# Patient Record
Sex: Male | Born: 1935 | Race: White | Hispanic: No | Marital: Single | Smoking: Former smoker
Health system: Southern US, Community
[De-identification: ages and names within clinical notes are randomized; demographics above are authoritative.]

## PROBLEM LIST (undated history)

## (undated) DIAGNOSIS — I219 Acute myocardial infarction, unspecified: Secondary | ICD-10-CM

## (undated) DIAGNOSIS — I2510 Atherosclerotic heart disease of native coronary artery without angina pectoris: Secondary | ICD-10-CM

## (undated) DIAGNOSIS — M4310 Spondylolisthesis, site unspecified: Secondary | ICD-10-CM

## (undated) DIAGNOSIS — M4800 Spinal stenosis, site unspecified: Secondary | ICD-10-CM

## (undated) HISTORY — DX: Spondylolisthesis, site unspecified: M43.10

## (undated) HISTORY — DX: Acute myocardial infarction, unspecified: I21.9

## (undated) HISTORY — PX: LUMBAR LAMINECTOMY: SHX95

## (undated) HISTORY — DX: Spinal stenosis, site unspecified: M48.00

## (undated) HISTORY — DX: Atherosclerotic heart disease of native coronary artery without angina pectoris: I25.10

---

## 1976-05-13 DIAGNOSIS — I219 Acute myocardial infarction, unspecified: Secondary | ICD-10-CM

## 1976-05-13 HISTORY — DX: Acute myocardial infarction, unspecified: I21.9

## 1997-05-13 HISTORY — PX: CORONARY ARTERY BYPASS GRAFT: SHX141

## 1997-08-22 ENCOUNTER — Inpatient Hospital Stay (HOSPITAL_COMMUNITY): Admission: RE | Admit: 1997-08-22 | Discharge: 1997-08-25 | Payer: Self-pay | Admitting: Cardiothoracic Surgery

## 1999-02-23 ENCOUNTER — Encounter: Payer: Self-pay | Admitting: Emergency Medicine

## 1999-02-24 ENCOUNTER — Inpatient Hospital Stay (HOSPITAL_COMMUNITY): Admission: EM | Admit: 1999-02-24 | Discharge: 1999-02-25 | Payer: Self-pay | Admitting: Emergency Medicine

## 1999-02-25 ENCOUNTER — Encounter: Payer: Self-pay | Admitting: Cardiology

## 1999-02-25 ENCOUNTER — Encounter: Payer: Self-pay | Admitting: Emergency Medicine

## 2001-04-17 ENCOUNTER — Encounter: Payer: Self-pay | Admitting: Orthopaedic Surgery

## 2001-04-17 ENCOUNTER — Ambulatory Visit (HOSPITAL_COMMUNITY): Admission: RE | Admit: 2001-04-17 | Discharge: 2001-04-17 | Payer: Self-pay | Admitting: Orthopaedic Surgery

## 2001-05-14 ENCOUNTER — Encounter: Payer: Self-pay | Admitting: Orthopaedic Surgery

## 2001-05-22 ENCOUNTER — Inpatient Hospital Stay (HOSPITAL_COMMUNITY): Admission: RE | Admit: 2001-05-22 | Discharge: 2001-05-26 | Payer: Self-pay | Admitting: Orthopaedic Surgery

## 2001-05-22 ENCOUNTER — Encounter: Payer: Self-pay | Admitting: Orthopaedic Surgery

## 2013-02-12 ENCOUNTER — Encounter: Payer: Self-pay | Admitting: *Deleted

## 2013-02-15 ENCOUNTER — Other Ambulatory Visit: Payer: Self-pay

## 2013-02-15 ENCOUNTER — Ambulatory Visit (HOSPITAL_COMMUNITY): Payer: Medicare HMO | Attending: Cardiology | Admitting: Radiology

## 2013-02-15 ENCOUNTER — Ambulatory Visit (INDEPENDENT_AMBULATORY_CARE_PROVIDER_SITE_OTHER): Payer: Medicare HMO | Admitting: Cardiology

## 2013-02-15 ENCOUNTER — Encounter: Payer: Self-pay | Admitting: Cardiology

## 2013-02-15 VITALS — BP 164/78 | HR 51 | Ht 68.0 in | Wt 164.0 lb

## 2013-02-15 DIAGNOSIS — I2510 Atherosclerotic heart disease of native coronary artery without angina pectoris: Secondary | ICD-10-CM | POA: Insufficient documentation

## 2013-02-15 DIAGNOSIS — E785 Hyperlipidemia, unspecified: Secondary | ICD-10-CM | POA: Insufficient documentation

## 2013-02-15 DIAGNOSIS — I25810 Atherosclerosis of coronary artery bypass graft(s) without angina pectoris: Secondary | ICD-10-CM | POA: Insufficient documentation

## 2013-02-15 DIAGNOSIS — I252 Old myocardial infarction: Secondary | ICD-10-CM | POA: Insufficient documentation

## 2013-02-15 DIAGNOSIS — I10 Essential (primary) hypertension: Secondary | ICD-10-CM | POA: Insufficient documentation

## 2013-02-15 HISTORY — DX: Atherosclerotic heart disease of native coronary artery without angina pectoris: I25.10

## 2013-02-15 NOTE — Patient Instructions (Addendum)

## 2013-02-15 NOTE — Progress Notes (Signed)
Echocardiogram performed.  

## 2013-02-15 NOTE — Progress Notes (Signed)
Patient ID: [name] [name], male   DOB: Jun 04, 1934, 78 y.o.   MRN: [id number]    Patient Name: [name] [name] Date of Encounter: 02/15/2013  Primary Care Provider:  No primary provider on file. Primary Cardiologist:  Tobias Alexander, H  Patient Profile  CAD, establishing care  Problem List   Past Medical History  Diagnosis Date  . Spondylolisthesis     L4-5  . Spinal stenosis     L4-5  . MI (myocardial infarction) 1978  . CAD (coronary artery disease)    Past Surgical History  Procedure Laterality Date  . Coronary artery bypass graft  1999    3 VESSEL  . Lumbar laminectomy      with partial facetectomy (L2-3, L3-4, L4-5, L5-S1)    Allergies  No Known Allergies  HPI  78 year old male with h/o CAD, s/o CABG in 1998. He reports that he felt exertional right shoulder pain and chest tightness prior to the surgery. Tat resolved and he has been asymptomatic since then. He has been followed by a PCP. He is very active, walking several miles a day, working in a back yard without any limitations. He denies palpitations, syncope. He is compliant with his meds.  Home Medications  Prior to Admission medications   Medication Sig Start Date End Date Taking? Authorizing Provider  aspirin 81 MG tablet Take 81 mg by mouth daily.   Yes Historical Provider, MD  beta carotene w/minerals (OCUVITE) tablet Take 2 tablets by mouth daily.   Yes Historical Provider, MD  fish oil-omega-3 fatty acids 1000 MG capsule Take 2 g by mouth daily.   Yes Historical Provider, MD  metoprolol succinate (TOPROL-XL) 25 MG 24 hr tablet  01/26/13  Yes Historical Provider, MD  naproxen (NAPROSYN) 375 MG tablet  01/11/13  Yes Historical Provider, MD  pravastatin (PRAVACHOL) 80 MG tablet  02/04/13  Yes Historical Provider, MD    Family History  No family history on file.  Social History  History   Social History  . Marital Status: Single    Spouse Name: N/A    Number of Children: N/A  . Years of  Education: N/A   Occupational History  . Not on file.   Social History Main Topics  . Smoking status: Former Smoker    Types: Cigarettes    Quit date: 11/19/1976  . Smokeless tobacco: Not on file  . Alcohol Use: Not on file  . Drug Use: Not on file  . Sexual Activity: Not on file   Other Topics Concern  . Not on file   Social History Narrative  . No narrative on file     Review of Systems General:  No chills, fever, night sweats or weight changes.  Cardiovascular:  No chest pain, dyspnea on exertion, edema, orthopnea, palpitations, paroxysmal nocturnal dyspnea. Dermatological: No rash, lesions/masses Respiratory: No cough, dyspnea Urologic: No hematuria, dysuria Abdominal:   No nausea, vomiting, diarrhea, bright red blood per rectum, melena, or hematemesis Neurologic:  No visual changes, wkns, changes in mental status. All other systems reviewed and are otherwise negative except as noted above.  Physical Exam  Blood pressure 164/78, pulse 51, height 5\' 8"  (1.727 m), weight 164 lb (74.39 kg).  General: Pleasant, NAD Psych: Normal affect. Neuro: Alert and oriented X 3. Moves all extremities spontaneously. HEENT: Normal  Neck: Supple without bruits or JVD. Lungs:  Resp regular and unlabored, CTA. Heart: RRR no s3, s4, or murmurs. Abdomen: Soft, non-tender, non-distended, BS + x  4.  Extremities: No clubbing, cyanosis or edema. DP/PT/Radials 2+ and equal bilaterally.  Accessory Clinical Findings  ECG - SB, 51 BPM, otherwise normal   Assessment & Plan  78 year old male   1. CAD - the patient is asymptomatic, doing great, exercising. We will continue BB, aspirin, statin.   2. Hypertension - rechecked 140/82, most probably white coat syndrome, will check echocardiogram to evaluate for diastolic function and LVH.  3. Hyperlipidemia - Lipids 2 weeks ago by his PCP, based on patient they were all WNL. Continue pravachol 80 mg QHS.   Follow up in 1 year unless any  change in symptoms  Tobias Alexander, Rexene Edison, MD 02/15/2013, 10:56 AM

## 2013-02-17 ENCOUNTER — Telehealth: Payer: Self-pay | Admitting: Cardiology

## 2013-02-17 MED ORDER — AMLODIPINE BESYLATE 2.5 MG PO TABS: 2.5000 mg | ORAL_TABLET | Freq: Every day | ORAL | Status: DC

## 2013-02-17 NOTE — Telephone Encounter (Signed)
New Problem  Pt returning call about echo results.

## 2013-02-17 NOTE — Telephone Encounter (Addendum)
**Note De-Identified Zyad Boomer Obfuscation** Pts wife, [name], is advised of pts Echo results and recommendation by Dr Delton See for the pt to start taking Amlodipine 2.5 mg daily. She verbalized understanding. Per her request RX sent to [facility] to fill.

## 2013-02-17 NOTE — Addendum Note (Signed)
**Note De-Identified John [name] Obfuscation** Addended by: Demetrios Loll on: 02/17/2013 09:46 AM   Modules accepted: Orders

## 2013-11-04 ENCOUNTER — Other Ambulatory Visit: Payer: Self-pay | Admitting: *Deleted

## 2013-11-04 MED ORDER — AMLODIPINE BESYLATE 2.5 MG PO TABS: 2.5000 mg | ORAL_TABLET | Freq: Every day | ORAL | Status: DC

## 2014-02-18 ENCOUNTER — Encounter: Payer: Self-pay | Admitting: Cardiology

## 2014-02-18 ENCOUNTER — Ambulatory Visit (INDEPENDENT_AMBULATORY_CARE_PROVIDER_SITE_OTHER): Payer: Medicare Other | Admitting: Cardiology

## 2014-02-18 VITALS — BP 122/72 | HR 50 | Ht 68.0 in | Wt 170.0 lb

## 2014-02-18 DIAGNOSIS — R079 Chest pain, unspecified: Secondary | ICD-10-CM

## 2014-02-18 DIAGNOSIS — R072 Precordial pain: Secondary | ICD-10-CM

## 2014-02-18 MED ORDER — AMLODIPINE BESYLATE 2.5 MG PO TABS: 2.5000 mg | ORAL_TABLET | Freq: Every day | ORAL | Status: DC

## 2014-02-18 MED ORDER — NITROGLYCERIN 0.4 MG SL SUBL: 0.4000 mg | SUBLINGUAL_TABLET | SUBLINGUAL | Status: DC | PRN

## 2014-02-18 MED ORDER — METOPROLOL SUCCINATE ER 25 MG PO TB24: 25.0000 mg | ORAL_TABLET | Freq: Every day | ORAL | Status: DC

## 2014-02-18 NOTE — Patient Instructions (Signed)
Your physician has recommended you make the following change in your medication:   DR NELSON HAS ORDERED FOR YOU TO TAKE NITROGLYCERIN 0.4 MG SUBLINGUAL (UNDER THE TONGUE) AS NEEDED FOR CHEST PAIN-PLEASE FOLLOW THE DIRECTIONS CAREFULLY.     Your physician has requested that you have a lexiscan myoview. For further information please visit [email or web address]. Please follow instruction sheet, as given.     Your physician wants you to follow-up in: [place] will receive a reminder letter in the mail two months in advance. If you don't receive a letter, please call our office to schedule the follow-up appointment.

## 2014-02-18 NOTE — Progress Notes (Signed)
Patient ID: Markese Bloxham, male   DOB: [birth year], 79 y.o.   MRN: [id number]    Patient Name: John [name] Date of Encounter: 02/18/2014  Primary Care Provider:  No primary provider on file. Primary Cardiologist:  Dorothy Spark  Patient Profile  CAD, establishing care  Problem List   Past Medical History  Diagnosis Date  . Spondylolisthesis     L4-5  . Spinal stenosis     L4-5  . MI (myocardial infarction) 1978  . CAD (coronary artery disease)    Past Surgical History  Procedure Laterality Date  . Coronary artery bypass graft  1999    3 VESSEL  . Lumbar laminectomy      with partial facetectomy (L2-3, L3-4, L4-5, L5-S1)   Allergies  No Known Allergies  HPI  79 year old male with h/o CAD, s/o CABG in 1998. He reports that he felt exertional right shoulder pain and chest tightness prior to the surgery. Tat resolved and he has been asymptomatic since then. He has been followed by a PCP. He is very active, walking several miles a day, working in a back yard without any limitations. He denies palpitations, syncope. He is compliant with his meds.  02/18/2014 - 1 year follow up, the patient is experiencing left sided chest pain, left arm and elbow pain that woke him up from sleep. He took 4 aspirins that relieved the pain, he still has some residual back and arm pain.  Home Medications  Prior to Admission medications   Medication Sig Start Date End Date Taking? Authorizing Provider  aspirin 81 MG tablet Take 81 mg by mouth daily.   Yes Historical Provider, MD  beta carotene w/minerals (OCUVITE) tablet Take 2 tablets by mouth daily.   Yes Historical Provider, MD  fish oil-omega-3 fatty acids 1000 MG capsule Take 2 g by mouth daily.   Yes Historical Provider, MD  metoprolol succinate (TOPROL-XL) 25 MG 24 hr tablet  01/26/13  Yes Historical Provider, MD  naproxen (NAPROSYN) 375 MG tablet  01/11/13  Yes Historical Provider, MD  pravastatin (PRAVACHOL) 80 MG tablet   02/04/13  Yes Historical Provider, MD    Family History  No family history on file.  Social History  History   Social History  . Marital Status: Single    Spouse Name: N/A    Number of Children: N/A  . Years of Education: N/A   Occupational History  . Not on file.   Social History Main Topics  . Smoking status: Former Smoker    Types: Cigarettes    Quit date: 11/19/1976  . Smokeless tobacco: Not on file  . Alcohol Use: Not on file  . Drug Use: Not on file  . Sexual Activity: Not on file   Other Topics Concern  . Not on file   Social History Narrative  . No narrative on file     Review of Systems General:  No chills, fever, night sweats or weight changes.  Cardiovascular:  No chest pain, dyspnea on exertion, edema, orthopnea, palpitations, paroxysmal nocturnal dyspnea. Dermatological: No rash, lesions/masses Respiratory: No cough, dyspnea Urologic: No hematuria, dysuria Abdominal:   No nausea, vomiting, diarrhea, bright red blood per rectum, melena, or hematemesis Neurologic:  No visual changes, wkns, changes in mental status. All other systems reviewed and are otherwise negative except as noted above.  Physical Exam  Blood pressure 122/72, pulse 50, height 5\' 8"  (1.727 m), weight 170 lb (77.111 kg).  General: Pleasant, NAD Psych:  Normal affect. Neuro: Alert and oriented X 3. Moves all extremities spontaneously. HEENT: Normal  Neck: Supple without bruits or JVD. Lungs:  Resp regular and unlabored, CTA. Heart: RRR no s3, s4, or murmurs. Abdomen: Soft, non-tender, non-distended, BS + x 4.  Extremities: No clubbing, cyanosis or edema. DP/PT/Radials 2+ and equal bilaterally.  Accessory Clinical Findings  ECG - SB, 51 BPM, non-specific ST-T wave abnormalities.  ECHO: 02/15/13  Left ventricle: The cavity size was normal. Wall thickness was increased in a pattern of mild LVH. Systolic function was normal. The estimated ejection fraction was in the range of  55% to 60%. Wall motion was normal; there were no regional wall motion abnormalities. Features are consistent with a pseudonormal left ventricular filling pattern, with concomitant abnormal relaxation and increased filling pressure (grade 2 diastolic dysfunction).    Assessment & Plan  79 year old male   1. CAD - the patient is symptomatic, symptoms are atypical, however, there is slight change on ECG when compared to the one from last year. Also his CABG is 79 years old. We will schedule exercise nuclear stress test and prescribe sl NTG. We will continue BB, aspirin, statin.   2. Hypertension - controlled after we added amlodipine the last year.   3. Hyperlipidemia - Lipids 2 weeks ago by his PCP, based on patient they were all WNL. Continue pravachol 80 mg QHS.    Dorothy Spark, MD 02/18/2014, 9:37 AM

## 2014-02-25 ENCOUNTER — Ambulatory Visit (HOSPITAL_COMMUNITY): Payer: Medicare Other | Attending: Cardiovascular Disease | Admitting: Radiology

## 2014-02-25 VITALS — BP 144/75 | HR 49 | Ht 68.0 in | Wt 170.0 lb

## 2014-02-25 DIAGNOSIS — M79602 Pain in left arm: Secondary | ICD-10-CM | POA: Diagnosis not present

## 2014-02-25 DIAGNOSIS — I10 Essential (primary) hypertension: Secondary | ICD-10-CM | POA: Diagnosis not present

## 2014-02-25 DIAGNOSIS — I2510 Atherosclerotic heart disease of native coronary artery without angina pectoris: Secondary | ICD-10-CM | POA: Diagnosis not present

## 2014-02-25 DIAGNOSIS — R079 Chest pain, unspecified: Secondary | ICD-10-CM | POA: Diagnosis present

## 2014-02-25 DIAGNOSIS — R9431 Abnormal electrocardiogram [ECG] [EKG]: Secondary | ICD-10-CM | POA: Diagnosis not present

## 2014-02-25 MED ORDER — TECHNETIUM TC 99M SESTAMIBI GENERIC - CARDIOLITE
30.0000 | Freq: Once | INTRAVENOUS | Status: AC | PRN
  Administered 2014-02-25: 30 via INTRAVENOUS

## 2014-02-25 MED ORDER — TECHNETIUM TC 99M SESTAMIBI GENERIC - CARDIOLITE
10.0000 | Freq: Once | INTRAVENOUS | Status: AC | PRN
  Administered 2014-02-25: 10 via INTRAVENOUS

## 2014-02-25 NOTE — Progress Notes (Signed)
[facility]UCLE[facility] [address] [place], [place] [address] [phone]    Cardiology Nuclear Med Study  John [name] is a 79 y.o. male     MRN : [id number]     DOB: 01/01/1935  Procedure Date: 02/25/2014  Nuclear Med Background Indication for Stress Test:  Evaluation for Ischemia and Abnormal EKG History:  CAD, MPI 2000 EF 51% Cardiac Risk Factors: Hypertension  Symptoms:  Chest Pain with left arm pain   Nuclear Pre-Procedure Caffeine/Decaff Intake:  None NPO After: 5 pm   Lungs:  clear O2 Sat: 97% on room air. IV 0.9% NS with Angio Cath:  22g  IV Site: R Hand  IV Started by:  Crissie Figures, RN  Chest Size (in):  46 Cup Size: n/a  Height: 5\' 8"  (1.727 m)  Weight:  170 lb (77.111 kg)  BMI:  Body mass index is 25.85 kg/(m^2). Tech Comments:  N/A    Nuclear Med Study 1 or 2 day study: 1 day  Stress Test Type:  Stress  Reading MD: N/A  Order Authorizing Provider:  Ena Dawley, MD  Resting Radionuclide: Technetium 20m Sestamibi  Resting Radionuclide Dose: 11.0 mCi   Stress Radionuclide:  Technetium 29m Sestamibi  Stress Radionuclide Dose: 33.0 mCi           Stress Protocol Rest HR: 49 Stress HR:131  Rest BP: 144/75 Stress BP: 239/90  Exercise Time (min): 4:40 METS: 6.6           Dose of Adenosine (mg):  n/a Dose of Lexiscan: n/a mg  Dose of Atropine (mg): n/a Dose of Dobutamine: n/a mcg/kg/min (at max HR)  Stress Test Technologist: Glade Lloyd, BS-ES  Nuclear Technologist:  Earl Many, CNMT     Rest Procedure:  Myocardial perfusion imaging was performed at rest 45 minutes following the intravenous administration of Technetium 31m Sestamibi. Rest ECG: Normal sinus rhythm. Mild diffuse ST scooping  Stress Procedure:  The patient exercised on the treadmill utilizing the Bruce Protocol for 4:40 minutes. The patient stopped due to increase BP and denied any chest pain.  Technetium 45m Sestamibi was injected at peak exercise and  myocardial perfusion imaging was performed after a brief delay. Stress ECG: No significant change from baseline ECG  QPS Raw Data Images:  Normal; no motion artifact; normal heart/lung ratio. Stress Images:  Normal homogeneous uptake in all areas of the myocardium. Rest Images:  Normal homogeneous uptake in all areas of the myocardium. Subtraction (SDS):  No evidence of ischemia. Transient Ischemic Dilatation (Normal <1.22):  0.89 Lung/Heart Ratio (Normal <0.45):  0.30  Quantitative Gated Spect Images QGS EDV:  118 ml QGS ESV:  51 ml  Impression Exercise Capacity:  Fair exercise capacity. BP Response:  Hypertensive blood pressure response. Clinical Symptoms:  No significant symptoms noted. ECG Impression:  No significant ST segment change suggestive of ischemia. Comparison with Prior Nuclear Study: No images to compare  Overall Impression:  Normal stress nuclear study. There is no scar or ischemia. Low risk scan. There is a hypertensive response to stress.  LV Ejection Fraction: 57%.  LV Wall Motion:  Normal Wall Motion.  Dola Argyle, MD

## 2014-03-01 ENCOUNTER — Telehealth: Payer: Self-pay | Admitting: *Deleted

## 2014-03-01 MED ORDER — AMLODIPINE BESYLATE 5 MG PO TABS: 5.0000 mg | ORAL_TABLET | Freq: Every day | ORAL | Status: DC

## 2014-03-01 NOTE — Telephone Encounter (Signed)
Notified pt about stress test results and Dr Francesca Oman recommendations.  Pt endorsed to speak with his wife instead, due to hearing difficulties.  Informed the wife that Dr Meda Coffee reviewed forwarded telephone note from 10/19 in concerns of pts severe hypertensive response to the stress test.  Informed the pts wife that per Dr Meda Coffee, she recommends the pt start taking Amlodipine 5 mg po daily instead of the 2.5mg , due to the severely elevated BP response to the stress test. Informed the wife that if the pt cannot tolerate this increase in dosage, to contact our office to further advise.  Confirmed the pharmacy of choice with the pts wife.  Pts wife and pt both verbalized understanding and agree with this plan.

## 2014-03-01 NOTE — Telephone Encounter (Signed)
Message copied by Nuala Alpha on Tue Mar 01, 2014  3:12 PM ------      Message from: Dorothy Spark      Created: Tue Mar 01, 2014  1:46 PM       His BP was severely elevated, he should try to take 5 mg po daily if he can tolerate it. ------

## 2015-02-13 ENCOUNTER — Other Ambulatory Visit: Payer: Self-pay | Admitting: Orthopaedic Surgery

## 2015-02-13 DIAGNOSIS — M5136 Other intervertebral disc degeneration, lumbar region: Secondary | ICD-10-CM

## 2015-02-22 ENCOUNTER — Ambulatory Visit: Payer: Self-pay | Admitting: Cardiology

## 2015-02-25 ENCOUNTER — Ambulatory Visit
Admission: RE | Admit: 2015-02-25 | Discharge: 2015-02-25 | Disposition: A | Discharge status: Home / Self Care | Payer: Self-pay | Source: Ambulatory Visit | Attending: Orthopaedic Surgery | Admitting: Orthopaedic Surgery

## 2015-02-25 DIAGNOSIS — M5136 Other intervertebral disc degeneration, lumbar region: Secondary | ICD-10-CM

## 2015-02-27 ENCOUNTER — Encounter: Payer: Self-pay | Admitting: Cardiology

## 2015-02-27 ENCOUNTER — Ambulatory Visit (INDEPENDENT_AMBULATORY_CARE_PROVIDER_SITE_OTHER): Payer: Medicare Other | Admitting: Cardiology

## 2015-02-27 VITALS — BP 130/74 | HR 51 | Ht 68.0 in | Wt 166.0 lb

## 2015-02-27 DIAGNOSIS — I10 Essential (primary) hypertension: Secondary | ICD-10-CM

## 2015-02-27 DIAGNOSIS — I2583 Coronary atherosclerosis due to lipid rich plaque: Secondary | ICD-10-CM

## 2015-02-27 DIAGNOSIS — Z951 Presence of aortocoronary bypass graft: Secondary | ICD-10-CM | POA: Diagnosis not present

## 2015-02-27 DIAGNOSIS — R072 Precordial pain: Secondary | ICD-10-CM

## 2015-02-27 DIAGNOSIS — E785 Hyperlipidemia, unspecified: Secondary | ICD-10-CM | POA: Diagnosis not present

## 2015-02-27 DIAGNOSIS — I2510 Atherosclerotic heart disease of native coronary artery without angina pectoris: Secondary | ICD-10-CM

## 2015-02-27 NOTE — Patient Instructions (Signed)
Medication Instructions:   Your physician recommends that you continue on your current medications as directed. Please refer to the Current Medication list given to you today.       Follow-Up:   Your physician wants you to follow-up in: ONE YEAR WITH DR NELSON You will receive a reminder letter in the mail two months in advance. If you don't receive a letter, please call our office to schedule the follow-up appointment.      

## 2015-02-27 NOTE — Progress Notes (Signed)
Patient ID: John [name], male   DOB: 1935/01/07, 80 y.o.   MRN: [id number]    Patient Name: John [name] Date of Encounter: 02/27/2015  Primary Care Provider:  Gilford Rile, MD Primary Cardiologist:  Dorothy Spark  Patient Profile  CAD, establishing care  Problem List   Past Medical History  Diagnosis Date  . Spondylolisthesis     L4-5  . Spinal stenosis     L4-5  . MI (myocardial infarction) ([place]) 1978  . CAD (coronary artery disease)    Past Surgical History  Procedure Laterality Date  . Coronary artery bypass graft  1999    3 VESSEL  . Lumbar laminectomy      with partial facetectomy (L2-3, L3-4, L4-5, L5-S1)   Allergies  No Known Allergies  HPI  80 year old male with h/o CAD, s/o CABG in 1998. He reports that he felt exertional right shoulder pain and chest tightness prior to the surgery. Tat resolved and he has been asymptomatic since then. He has been followed by a PCP. He is very active, walking several miles a day, working in a back yard without any limitations. He denies palpitations, syncope. He is compliant with his meds.  02/18/2014 - 1 year follow up, the patient is experiencing left sided chest pain, left arm and elbow pain that woke him up from sleep. He took 4 aspirins that relieved the pain, he still has some residual back and arm pain.  02/27/2015 - 1 year follow up, at the last year visit he complained of atypical chest pain, an exercise nuclear stress test was negative for ischemia but he had hypertensive response to exercise, we increased amlodipine to 5 mg po daily. 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 No limitations, no palpitations, LE edema, claudications.  His only complain is lower back pain, he is seeing an orthopedic surgeon.   Home Medications  Prior to Admission medications   Medication Sig Start Date End Date Taking? Authorizing Provider  aspirin 81 MG tablet Take 81 mg by mouth daily.    Yes Historical Provider, MD  beta carotene w/minerals (OCUVITE) tablet Take 2 tablets by mouth daily.   Yes Historical Provider, MD  fish oil-omega-3 fatty acids 1000 MG capsule Take 2 g by mouth daily.   Yes Historical Provider, MD  metoprolol succinate (TOPROL-XL) 25 MG 24 hr tablet  01/26/13  Yes Historical Provider, MD  naproxen (NAPROSYN) 375 MG tablet  01/11/13  Yes Historical Provider, MD  pravastatin (PRAVACHOL) 80 MG tablet  02/04/13  Yes Historical Provider, MD    Family History  No family history on file.  Social History  Social History   Social History  . Marital Status: Single    Spouse Name: N/A  . Number of Children: N/A  . Years of Education: N/A   Occupational History  . Not on file.   Social History Main Topics  . Smoking status: Former Smoker    Types: Cigarettes    Quit date: 11/19/1976  . Smokeless tobacco: Not on file  . Alcohol Use: Not on file  . Drug Use: Not on file  . Sexual Activity: Not on file   Other Topics Concern  . Not on file   Social History Narrative     Review of Systems General:  No chills, fever, night sweats or weight changes.  Cardiovascular:  No chest pain, dyspnea on exertion, edema, orthopnea, palpitations, paroxysmal nocturnal dyspnea. Dermatological: No rash,  lesions/masses Respiratory: No cough, dyspnea Urologic: No hematuria, dysuria Abdominal:   No nausea, vomiting, diarrhea, bright red blood per rectum, melena, or hematemesis Neurologic:  No visual changes, wkns, changes in mental status. All other systems reviewed and are otherwise negative except as noted above.  Physical Exam  Blood pressure 130/74, pulse 51, height 5\' 8"  (1.727 m), weight 166 lb (75.297 kg).  General: Pleasant, NAD Psych: Normal affect. Neuro: Alert and oriented X 3. Moves all extremities spontaneously. HEENT: Normal  Neck: Supple without bruits or JVD. Lungs:  Resp regular and unlabored, CTA. Heart: RRR no s3, s4, or murmurs. Abdomen:  Soft, non-tender, non-distended, BS + x 4.  Extremities: No clubbing, cyanosis or edema. DP/PT/Radials 2+ and equal bilaterally.  Accessory Clinical Findings  ECG - SB, 51 BPM, non-specific ST-T wave abnormalities.  ECHO: 02/15/13 Left ventricle: The cavity size was normal. Wall thickness was increased in a pattern of mild LVH. Systolic function was normal. The estimated ejection fraction was in the range of 55% to 60%. Wall motion was normal; there were no regional wall motion abnormalities. Features are consistent with a pseudonormal left ventricular filling pattern, with concomitant abnormal relaxation and increased filling pressure (grade 2 diastolic dysfunction).  Nuclear stress test 01/2014 Impression Exercise Capacity:  Fair exercise capacity. BP Response:  Hypertensive blood pressure response. Clinical Symptoms:  No significant symptoms noted. ECG Impression:  No significant ST segment change suggestive of ischemia. Comparison with Prior Nuclear Study: No images to compare  Overall Impression:  Normal stress nuclear study. There is no scar or ischemia. Low risk scan. There is a hypertensive response to stress. LV Ejection Fraction: 57%.  LV Wall Motion:  Normal Wall Motion.    Assessment & Plan  80 year old male   1. CAD - the patient is asymptomatic, Negative stress test a year ago.  Also his CABG is 80 years old. We will schedule exercise nuclear stress test and prescribe sl NTG. We will continue BB, aspirin, statin.   2. Hypertension - controlled after we increasing amlodipine to 5 mg po daily the last year.   3. Hyperlipidemia - followed by his PCP, based on patient they were all WNL. Continue pravachol 80 mg QHS.   Follow up in 1 year, obtain ECG done at PCP office a week ago.  Dorothy Spark, MD 02/27/2015, 12:02 PM

## 2015-07-20 DIAGNOSIS — M4806 Spinal stenosis, lumbar region: Secondary | ICD-10-CM | POA: Diagnosis not present

## 2015-08-07 DIAGNOSIS — M1990 Unspecified osteoarthritis, unspecified site: Secondary | ICD-10-CM | POA: Diagnosis not present

## 2015-08-07 DIAGNOSIS — I10 Essential (primary) hypertension: Secondary | ICD-10-CM | POA: Diagnosis not present

## 2015-08-07 DIAGNOSIS — E785 Hyperlipidemia, unspecified: Secondary | ICD-10-CM | POA: Diagnosis not present

## 2015-08-07 DIAGNOSIS — I2510 Atherosclerotic heart disease of native coronary artery without angina pectoris: Secondary | ICD-10-CM | POA: Diagnosis not present

## 2015-08-07 DIAGNOSIS — Z0000 Encounter for general adult medical examination without abnormal findings: Secondary | ICD-10-CM | POA: Diagnosis not present

## 2015-08-07 DIAGNOSIS — R350 Frequency of micturition: Secondary | ICD-10-CM | POA: Diagnosis not present

## 2015-08-11 DIAGNOSIS — E785 Hyperlipidemia, unspecified: Secondary | ICD-10-CM | POA: Diagnosis not present

## 2015-08-11 DIAGNOSIS — R739 Hyperglycemia, unspecified: Secondary | ICD-10-CM | POA: Diagnosis not present

## 2015-08-11 DIAGNOSIS — Z125 Encounter for screening for malignant neoplasm of prostate: Secondary | ICD-10-CM | POA: Diagnosis not present

## 2015-08-11 DIAGNOSIS — Z79899 Other long term (current) drug therapy: Secondary | ICD-10-CM | POA: Diagnosis not present

## 2015-08-11 DIAGNOSIS — E559 Vitamin D deficiency, unspecified: Secondary | ICD-10-CM | POA: Diagnosis not present

## 2015-08-14 ENCOUNTER — Encounter: Payer: Self-pay | Admitting: *Deleted

## 2015-08-14 ENCOUNTER — Telehealth: Payer: Self-pay | Admitting: Cardiology

## 2015-08-14 NOTE — Telephone Encounter (Signed)
[facility] sent this pts surgical/medical clearance for the pt to have a L2-5 Revision Laminectomy and Fusion with TLIF and pedicle screws, removal of hardware form in for Dr Meda Coffee to review and advise on.   The pts procedure is scheduled for next Monday 08/21/15.   Per Deanna, with Dr Patrice Paradise, the clearance that will be required is as mentioned below:  --Is it ok for this patient to stop taking his Asprin 5-7 days prior to his surgery next week 08/21/15 and is he ok to proceed with surgery from Dr [facility] medical standpoint.    Clearance letter to be written by Dr Meda Coffee and faxed to [facility] at [facility] at [phone] and telephone-[phone].    Will route this clearance request to Dr Meda Coffee to review and advise on, and follow-up with the pt and Deanna thereafter.

## 2015-08-14 NOTE — Telephone Encounter (Signed)
Clearance letter written and will be sent to [place], surgery scheduler at [facility], fax at [phone].  Left a detailed message on the pts VM that this clearance has been faxed today and will be received by [facility] tomorrow 08/15/15.

## 2015-08-14 NOTE — Telephone Encounter (Signed)
Yes, no contraindication from cardiac standpoint. Can stop aspirin now, continue metoprolol.  Restart aspirin as soon as acceptable from surgical standpoint.  K

## 2015-08-14 NOTE — Telephone Encounter (Signed)
New Message  Pt wife callluing to follow up on surgical clearance- stated it was sent by Dr Roxy Manns already. Please call back and discuss.  Can also call:  [phone]

## 2015-08-18 DIAGNOSIS — M961 Postlaminectomy syndrome, not elsewhere classified: Secondary | ICD-10-CM | POA: Diagnosis not present

## 2015-08-18 DIAGNOSIS — M4806 Spinal stenosis, lumbar region: Secondary | ICD-10-CM | POA: Diagnosis not present

## 2015-08-18 DIAGNOSIS — M4716 Other spondylosis with myelopathy, lumbar region: Secondary | ICD-10-CM | POA: Diagnosis not present

## 2015-08-18 DIAGNOSIS — Z4689 Encounter for fitting and adjustment of other specified devices: Secondary | ICD-10-CM | POA: Diagnosis not present

## 2015-08-22 ENCOUNTER — Other Ambulatory Visit: Payer: Self-pay | Admitting: Cardiology

## 2015-08-29 DIAGNOSIS — Z01812 Encounter for preprocedural laboratory examination: Secondary | ICD-10-CM | POA: Diagnosis not present

## 2015-09-04 DIAGNOSIS — T84296A Other mechanical complication of internal fixation device of vertebrae, initial encounter: Secondary | ICD-10-CM | POA: Diagnosis not present

## 2015-09-04 DIAGNOSIS — M4806 Spinal stenosis, lumbar region: Secondary | ICD-10-CM | POA: Diagnosis not present

## 2015-09-04 DIAGNOSIS — E785 Hyperlipidemia, unspecified: Secondary | ICD-10-CM | POA: Diagnosis not present

## 2015-09-04 DIAGNOSIS — M4326 Fusion of spine, lumbar region: Secondary | ICD-10-CM | POA: Diagnosis not present

## 2015-09-04 DIAGNOSIS — M4716 Other spondylosis with myelopathy, lumbar region: Secondary | ICD-10-CM | POA: Diagnosis not present

## 2015-09-04 DIAGNOSIS — I10 Essential (primary) hypertension: Secondary | ICD-10-CM | POA: Diagnosis not present

## 2015-09-04 DIAGNOSIS — M961 Postlaminectomy syndrome, not elsewhere classified: Secondary | ICD-10-CM | POA: Diagnosis not present

## 2015-09-04 DIAGNOSIS — M1990 Unspecified osteoarthritis, unspecified site: Secondary | ICD-10-CM | POA: Diagnosis not present

## 2015-09-04 DIAGNOSIS — M4150 Other secondary scoliosis, site unspecified: Secondary | ICD-10-CM | POA: Diagnosis not present

## 2015-09-04 DIAGNOSIS — I2510 Atherosclerotic heart disease of native coronary artery without angina pectoris: Secondary | ICD-10-CM | POA: Diagnosis not present

## 2015-09-07 DIAGNOSIS — M4186 Other forms of scoliosis, lumbar region: Secondary | ICD-10-CM | POA: Diagnosis not present

## 2015-09-07 DIAGNOSIS — I10 Essential (primary) hypertension: Secondary | ICD-10-CM | POA: Diagnosis not present

## 2015-09-07 DIAGNOSIS — Z4789 Encounter for other orthopedic aftercare: Secondary | ICD-10-CM | POA: Diagnosis not present

## 2015-09-07 DIAGNOSIS — I2510 Atherosclerotic heart disease of native coronary artery without angina pectoris: Secondary | ICD-10-CM | POA: Diagnosis not present

## 2015-09-07 DIAGNOSIS — M545 Low back pain: Secondary | ICD-10-CM | POA: Diagnosis not present

## 2015-09-07 DIAGNOSIS — E785 Hyperlipidemia, unspecified: Secondary | ICD-10-CM | POA: Diagnosis not present

## 2015-09-07 DIAGNOSIS — Z87891 Personal history of nicotine dependence: Secondary | ICD-10-CM | POA: Diagnosis not present

## 2015-09-07 DIAGNOSIS — M4806 Spinal stenosis, lumbar region: Secondary | ICD-10-CM | POA: Diagnosis not present

## 2015-09-07 DIAGNOSIS — M47816 Spondylosis without myelopathy or radiculopathy, lumbar region: Secondary | ICD-10-CM | POA: Diagnosis not present

## 2015-09-11 DIAGNOSIS — M47816 Spondylosis without myelopathy or radiculopathy, lumbar region: Secondary | ICD-10-CM | POA: Diagnosis not present

## 2015-09-11 DIAGNOSIS — Z4789 Encounter for other orthopedic aftercare: Secondary | ICD-10-CM | POA: Diagnosis not present

## 2015-09-11 DIAGNOSIS — M545 Low back pain: Secondary | ICD-10-CM | POA: Diagnosis not present

## 2015-09-11 DIAGNOSIS — M4806 Spinal stenosis, lumbar region: Secondary | ICD-10-CM | POA: Diagnosis not present

## 2015-09-11 DIAGNOSIS — I2510 Atherosclerotic heart disease of native coronary artery without angina pectoris: Secondary | ICD-10-CM | POA: Diagnosis not present

## 2015-09-11 DIAGNOSIS — E785 Hyperlipidemia, unspecified: Secondary | ICD-10-CM | POA: Diagnosis not present

## 2015-09-11 DIAGNOSIS — Z87891 Personal history of nicotine dependence: Secondary | ICD-10-CM | POA: Diagnosis not present

## 2015-09-11 DIAGNOSIS — I10 Essential (primary) hypertension: Secondary | ICD-10-CM | POA: Diagnosis not present

## 2015-09-11 DIAGNOSIS — M4186 Other forms of scoliosis, lumbar region: Secondary | ICD-10-CM | POA: Diagnosis not present

## 2015-09-12 DIAGNOSIS — M4306 Spondylolysis, lumbar region: Secondary | ICD-10-CM | POA: Diagnosis not present

## 2015-09-12 DIAGNOSIS — M419 Scoliosis, unspecified: Secondary | ICD-10-CM | POA: Diagnosis not present

## 2015-09-13 DIAGNOSIS — E785 Hyperlipidemia, unspecified: Secondary | ICD-10-CM | POA: Diagnosis not present

## 2015-09-13 DIAGNOSIS — I2510 Atherosclerotic heart disease of native coronary artery without angina pectoris: Secondary | ICD-10-CM | POA: Diagnosis not present

## 2015-09-13 DIAGNOSIS — I10 Essential (primary) hypertension: Secondary | ICD-10-CM | POA: Diagnosis not present

## 2015-09-13 DIAGNOSIS — M47816 Spondylosis without myelopathy or radiculopathy, lumbar region: Secondary | ICD-10-CM | POA: Diagnosis not present

## 2015-09-13 DIAGNOSIS — M4806 Spinal stenosis, lumbar region: Secondary | ICD-10-CM | POA: Diagnosis not present

## 2015-09-13 DIAGNOSIS — M4186 Other forms of scoliosis, lumbar region: Secondary | ICD-10-CM | POA: Diagnosis not present

## 2015-09-13 DIAGNOSIS — Z87891 Personal history of nicotine dependence: Secondary | ICD-10-CM | POA: Diagnosis not present

## 2015-09-13 DIAGNOSIS — Z4789 Encounter for other orthopedic aftercare: Secondary | ICD-10-CM | POA: Diagnosis not present

## 2015-09-13 DIAGNOSIS — M545 Low back pain: Secondary | ICD-10-CM | POA: Diagnosis not present

## 2015-09-17 DIAGNOSIS — M4186 Other forms of scoliosis, lumbar region: Secondary | ICD-10-CM | POA: Diagnosis not present

## 2015-09-17 DIAGNOSIS — M47816 Spondylosis without myelopathy or radiculopathy, lumbar region: Secondary | ICD-10-CM | POA: Diagnosis not present

## 2015-09-17 DIAGNOSIS — E785 Hyperlipidemia, unspecified: Secondary | ICD-10-CM | POA: Diagnosis not present

## 2015-09-17 DIAGNOSIS — I2510 Atherosclerotic heart disease of native coronary artery without angina pectoris: Secondary | ICD-10-CM | POA: Diagnosis not present

## 2015-09-17 DIAGNOSIS — Z4789 Encounter for other orthopedic aftercare: Secondary | ICD-10-CM | POA: Diagnosis not present

## 2015-09-17 DIAGNOSIS — I10 Essential (primary) hypertension: Secondary | ICD-10-CM | POA: Diagnosis not present

## 2015-09-17 DIAGNOSIS — Z87891 Personal history of nicotine dependence: Secondary | ICD-10-CM | POA: Diagnosis not present

## 2015-09-17 DIAGNOSIS — M4806 Spinal stenosis, lumbar region: Secondary | ICD-10-CM | POA: Diagnosis not present

## 2015-09-17 DIAGNOSIS — M545 Low back pain: Secondary | ICD-10-CM | POA: Diagnosis not present

## 2015-09-19 DIAGNOSIS — Z4789 Encounter for other orthopedic aftercare: Secondary | ICD-10-CM | POA: Diagnosis not present

## 2015-09-19 DIAGNOSIS — E785 Hyperlipidemia, unspecified: Secondary | ICD-10-CM | POA: Diagnosis not present

## 2015-09-19 DIAGNOSIS — I10 Essential (primary) hypertension: Secondary | ICD-10-CM | POA: Diagnosis not present

## 2015-09-19 DIAGNOSIS — M4186 Other forms of scoliosis, lumbar region: Secondary | ICD-10-CM | POA: Diagnosis not present

## 2015-09-19 DIAGNOSIS — M545 Low back pain: Secondary | ICD-10-CM | POA: Diagnosis not present

## 2015-09-19 DIAGNOSIS — Z87891 Personal history of nicotine dependence: Secondary | ICD-10-CM | POA: Diagnosis not present

## 2015-09-19 DIAGNOSIS — I2510 Atherosclerotic heart disease of native coronary artery without angina pectoris: Secondary | ICD-10-CM | POA: Diagnosis not present

## 2015-09-19 DIAGNOSIS — M4806 Spinal stenosis, lumbar region: Secondary | ICD-10-CM | POA: Diagnosis not present

## 2015-09-19 DIAGNOSIS — M47816 Spondylosis without myelopathy or radiculopathy, lumbar region: Secondary | ICD-10-CM | POA: Diagnosis not present

## 2015-09-20 ENCOUNTER — Telehealth: Payer: Self-pay | Admitting: *Deleted

## 2015-09-20 DIAGNOSIS — M47816 Spondylosis without myelopathy or radiculopathy, lumbar region: Secondary | ICD-10-CM | POA: Diagnosis not present

## 2015-09-20 DIAGNOSIS — M4806 Spinal stenosis, lumbar region: Secondary | ICD-10-CM | POA: Diagnosis not present

## 2015-09-20 DIAGNOSIS — M4186 Other forms of scoliosis, lumbar region: Secondary | ICD-10-CM | POA: Diagnosis not present

## 2015-09-20 DIAGNOSIS — Z4789 Encounter for other orthopedic aftercare: Secondary | ICD-10-CM | POA: Diagnosis not present

## 2015-09-20 NOTE — Telephone Encounter (Signed)
I would cut his amlodipine to 2.5 mg po daily

## 2015-09-20 NOTE — Telephone Encounter (Signed)
-----   Message from [place] sent at 09/20/2015  1:00 PM EDT ----- Marykay Lex,   Wife calling wanting to speak with someone about husband BP.   Thanks, Maudie Mercury

## 2015-09-20 NOTE — Telephone Encounter (Signed)
Spoke with the pts wife (with the pts verbal consent to talk to her) and she is only calling to inform us that the pts BP has been running anywhere from 100/57 to 125/60 range.  Wife is worried this is too low for the pt.  Wife states that it typically runs lower in the mornings and higher in the evenings.  Wife states that the pts BP use to be 150/70 and above, and then he had his back surgery on 09/04/15, and its starting to run lower.  Wife correlates his lower BP readings with having his back surgery and being pain free.  Wife states that the pt is completely asymptomatic with all readings mentioned below.  Wife reports the pt has no cardiac complaints at all.  No dizziness, no cp or sob, no pre-syncope or syncopal episodes.  Informed the wife that the pts BP readings are very much so acceptable.  Informed the wife that typically in the mornings are BP will read lower and higher in the evenings.  Informed the wife that the pts BP is mostly WNLs. Advised the wife that if she is concerned with his BP dropping too low, and he has symptoms, she could hydrate him well with po fluids and recheck it thereafter.  Encouraged the wife to make sure the pt is obtaining enough hydration and nutrition.  Informed the wife if the pt is on pain meds post-op, this can also make your BP lower.  Informed the wife that Dr Meda Coffee is out of the office the rest of the week, but I will route this message to her for further review and recommendation if any, and follow-up with her thereafter.  Wife verbalized understanding and agrees with this plan.

## 2015-09-22 MED ORDER — AMLODIPINE BESYLATE 2.5 MG PO TABS: 2.5000 mg | ORAL_TABLET | Freq: Every day | ORAL | Status: DC

## 2015-09-22 NOTE — Addendum Note (Signed)
Addended by: Nuala Alpha on: 09/22/2015 08:14 AM   Modules accepted: Orders, Medications

## 2015-09-22 NOTE — Telephone Encounter (Addendum)
Notified the pt and wife that per Dr Meda Coffee, she recommends that we decrease his amlodipine to 2.5 mg po daily.  Confirmed the pharmacy of choice with the pt and wife.  Advised the wife to continue monitoring the pts BP, but she should only do this one time a day, unless needed.  Advised the wife to take the pts BP after he has eaten breakfast and taken his morning meds.  Advised her to take his BP 30 mins after he eats and takes his meds.  Both verbalized understanding and agrees with this plan.

## 2015-09-29 ENCOUNTER — Telehealth: Payer: Self-pay | Admitting: Cardiology

## 2015-09-29 NOTE — Telephone Encounter (Signed)
New message     Pt c/o medication issue:  1. Name of Medication: Amlodipine  2. How are you currently taking this medication (dosage and times per day)? 2.5 mg po once daily  3. Are you having a reaction (difficulty breathing--STAT)? no  4. What is your medication issue? The wife is concerned pt's pulse is staying high, took blood pressure this am 107/61 but pulse is 83

## 2015-09-29 NOTE — Telephone Encounter (Signed)
Pts wife calling to report to Dr Meda Coffee that ever since she started him on amlodipine 2.5 mg po daily, his BP and HR have improved tremendously.  Pts wife states that the pts BP has been running around 110/80-108/60 range and HR anywhere from 78-88.  Informed the pts wife that his BP and HR are WNL and I will inform Dr Meda Coffee of his current readings. Wife reports he feels much better too.  Wife verbalized understanding and agrees with this plan.

## 2015-10-05 DIAGNOSIS — M549 Dorsalgia, unspecified: Secondary | ICD-10-CM | POA: Diagnosis not present

## 2015-10-10 DIAGNOSIS — M47816 Spondylosis without myelopathy or radiculopathy, lumbar region: Secondary | ICD-10-CM | POA: Diagnosis not present

## 2015-10-19 DIAGNOSIS — H903 Sensorineural hearing loss, bilateral: Secondary | ICD-10-CM | POA: Diagnosis not present

## 2015-10-25 DIAGNOSIS — M1611 Unilateral primary osteoarthritis, right hip: Secondary | ICD-10-CM | POA: Diagnosis not present

## 2015-11-15 ENCOUNTER — Telehealth: Payer: Self-pay | Admitting: Cardiology

## 2015-11-15 NOTE — Telephone Encounter (Signed)
New Message:   Pt is going to have hip surgery on 12-06-15,does need to see Dr Meda Coffee before surgery?

## 2015-11-15 NOTE — Telephone Encounter (Signed)
Informed the pt and wife that they should contact the Surgeon's office and provide them with our fax information [phone] attention Dr Meda Coffee), to send clearance to Dr Meda Coffee to review and advise on.  Informed both parties that once Dr Meda Coffee receives the clearance form and advises on this, we will then re-fax this accordingly thereafter.  Both verbalized understanding and agrees with this plan.

## 2015-11-20 DIAGNOSIS — M1611 Unilateral primary osteoarthritis, right hip: Secondary | ICD-10-CM | POA: Diagnosis not present

## 2015-11-22 DIAGNOSIS — E559 Vitamin D deficiency, unspecified: Secondary | ICD-10-CM | POA: Diagnosis not present

## 2015-11-22 DIAGNOSIS — Z9181 History of falling: Secondary | ICD-10-CM | POA: Diagnosis not present

## 2015-11-22 DIAGNOSIS — M545 Low back pain: Secondary | ICD-10-CM | POA: Diagnosis not present

## 2015-11-22 DIAGNOSIS — Z01818 Encounter for other preprocedural examination: Secondary | ICD-10-CM | POA: Diagnosis not present

## 2015-11-22 DIAGNOSIS — Z1389 Encounter for screening for other disorder: Secondary | ICD-10-CM | POA: Diagnosis not present

## 2015-11-22 DIAGNOSIS — M1611 Unilateral primary osteoarthritis, right hip: Secondary | ICD-10-CM | POA: Diagnosis not present

## 2015-11-22 DIAGNOSIS — E785 Hyperlipidemia, unspecified: Secondary | ICD-10-CM | POA: Diagnosis not present

## 2015-11-22 DIAGNOSIS — Z79899 Other long term (current) drug therapy: Secondary | ICD-10-CM | POA: Diagnosis not present

## 2015-11-27 DIAGNOSIS — Z01818 Encounter for other preprocedural examination: Secondary | ICD-10-CM | POA: Diagnosis not present

## 2015-11-27 DIAGNOSIS — M5134 Other intervertebral disc degeneration, thoracic region: Secondary | ICD-10-CM | POA: Diagnosis not present

## 2015-11-27 DIAGNOSIS — M1611 Unilateral primary osteoarthritis, right hip: Secondary | ICD-10-CM | POA: Diagnosis not present

## 2015-11-27 DIAGNOSIS — Z951 Presence of aortocoronary bypass graft: Secondary | ICD-10-CM | POA: Diagnosis not present

## 2015-11-29 DIAGNOSIS — M961 Postlaminectomy syndrome, not elsewhere classified: Secondary | ICD-10-CM | POA: Diagnosis not present

## 2015-12-06 DIAGNOSIS — M419 Scoliosis, unspecified: Secondary | ICD-10-CM | POA: Diagnosis not present

## 2015-12-06 DIAGNOSIS — I252 Old myocardial infarction: Secondary | ICD-10-CM | POA: Diagnosis not present

## 2015-12-06 DIAGNOSIS — E785 Hyperlipidemia, unspecified: Secondary | ICD-10-CM | POA: Diagnosis not present

## 2015-12-06 DIAGNOSIS — Z981 Arthrodesis status: Secondary | ICD-10-CM | POA: Diagnosis not present

## 2015-12-06 DIAGNOSIS — M4306 Spondylolysis, lumbar region: Secondary | ICD-10-CM | POA: Diagnosis not present

## 2015-12-06 DIAGNOSIS — I2510 Atherosclerotic heart disease of native coronary artery without angina pectoris: Secondary | ICD-10-CM | POA: Diagnosis not present

## 2015-12-06 DIAGNOSIS — Z87891 Personal history of nicotine dependence: Secondary | ICD-10-CM | POA: Diagnosis not present

## 2015-12-06 DIAGNOSIS — H3530 Unspecified macular degeneration: Secondary | ICD-10-CM | POA: Diagnosis not present

## 2015-12-06 DIAGNOSIS — M1611 Unilateral primary osteoarthritis, right hip: Secondary | ICD-10-CM | POA: Diagnosis not present

## 2015-12-06 DIAGNOSIS — Z951 Presence of aortocoronary bypass graft: Secondary | ICD-10-CM | POA: Diagnosis not present

## 2015-12-06 DIAGNOSIS — Z8614 Personal history of Methicillin resistant Staphylococcus aureus infection: Secondary | ICD-10-CM | POA: Diagnosis not present

## 2015-12-06 DIAGNOSIS — Z96641 Presence of right artificial hip joint: Secondary | ICD-10-CM | POA: Diagnosis not present

## 2015-12-09 DIAGNOSIS — Z96641 Presence of right artificial hip joint: Secondary | ICD-10-CM | POA: Diagnosis not present

## 2015-12-09 DIAGNOSIS — Z951 Presence of aortocoronary bypass graft: Secondary | ICD-10-CM | POA: Diagnosis not present

## 2015-12-09 DIAGNOSIS — I2510 Atherosclerotic heart disease of native coronary artery without angina pectoris: Secondary | ICD-10-CM | POA: Diagnosis not present

## 2015-12-09 DIAGNOSIS — R269 Unspecified abnormalities of gait and mobility: Secondary | ICD-10-CM | POA: Diagnosis not present

## 2015-12-09 DIAGNOSIS — Z471 Aftercare following joint replacement surgery: Secondary | ICD-10-CM | POA: Diagnosis not present

## 2015-12-09 DIAGNOSIS — I252 Old myocardial infarction: Secondary | ICD-10-CM | POA: Diagnosis not present

## 2015-12-09 DIAGNOSIS — H3530 Unspecified macular degeneration: Secondary | ICD-10-CM | POA: Diagnosis not present

## 2015-12-09 DIAGNOSIS — E785 Hyperlipidemia, unspecified: Secondary | ICD-10-CM | POA: Diagnosis not present

## 2015-12-11 DIAGNOSIS — R269 Unspecified abnormalities of gait and mobility: Secondary | ICD-10-CM | POA: Diagnosis not present

## 2015-12-11 DIAGNOSIS — Z96641 Presence of right artificial hip joint: Secondary | ICD-10-CM | POA: Diagnosis not present

## 2015-12-11 DIAGNOSIS — I2510 Atherosclerotic heart disease of native coronary artery without angina pectoris: Secondary | ICD-10-CM | POA: Diagnosis not present

## 2015-12-11 DIAGNOSIS — E785 Hyperlipidemia, unspecified: Secondary | ICD-10-CM | POA: Diagnosis not present

## 2015-12-11 DIAGNOSIS — Z951 Presence of aortocoronary bypass graft: Secondary | ICD-10-CM | POA: Diagnosis not present

## 2015-12-11 DIAGNOSIS — I252 Old myocardial infarction: Secondary | ICD-10-CM | POA: Diagnosis not present

## 2015-12-11 DIAGNOSIS — H3530 Unspecified macular degeneration: Secondary | ICD-10-CM | POA: Diagnosis not present

## 2015-12-11 DIAGNOSIS — Z471 Aftercare following joint replacement surgery: Secondary | ICD-10-CM | POA: Diagnosis not present

## 2015-12-12 DIAGNOSIS — Z96641 Presence of right artificial hip joint: Secondary | ICD-10-CM | POA: Diagnosis not present

## 2015-12-12 DIAGNOSIS — I2510 Atherosclerotic heart disease of native coronary artery without angina pectoris: Secondary | ICD-10-CM | POA: Diagnosis not present

## 2015-12-12 DIAGNOSIS — H3530 Unspecified macular degeneration: Secondary | ICD-10-CM | POA: Diagnosis not present

## 2015-12-12 DIAGNOSIS — I252 Old myocardial infarction: Secondary | ICD-10-CM | POA: Diagnosis not present

## 2015-12-12 DIAGNOSIS — R269 Unspecified abnormalities of gait and mobility: Secondary | ICD-10-CM | POA: Diagnosis not present

## 2015-12-12 DIAGNOSIS — Z951 Presence of aortocoronary bypass graft: Secondary | ICD-10-CM | POA: Diagnosis not present

## 2015-12-12 DIAGNOSIS — Z471 Aftercare following joint replacement surgery: Secondary | ICD-10-CM | POA: Diagnosis not present

## 2015-12-12 DIAGNOSIS — E785 Hyperlipidemia, unspecified: Secondary | ICD-10-CM | POA: Diagnosis not present

## 2015-12-13 DIAGNOSIS — E785 Hyperlipidemia, unspecified: Secondary | ICD-10-CM | POA: Diagnosis not present

## 2015-12-13 DIAGNOSIS — Z96641 Presence of right artificial hip joint: Secondary | ICD-10-CM | POA: Diagnosis not present

## 2015-12-13 DIAGNOSIS — I252 Old myocardial infarction: Secondary | ICD-10-CM | POA: Diagnosis not present

## 2015-12-13 DIAGNOSIS — Z951 Presence of aortocoronary bypass graft: Secondary | ICD-10-CM | POA: Diagnosis not present

## 2015-12-13 DIAGNOSIS — Z471 Aftercare following joint replacement surgery: Secondary | ICD-10-CM | POA: Diagnosis not present

## 2015-12-13 DIAGNOSIS — R269 Unspecified abnormalities of gait and mobility: Secondary | ICD-10-CM | POA: Diagnosis not present

## 2015-12-13 DIAGNOSIS — H3530 Unspecified macular degeneration: Secondary | ICD-10-CM | POA: Diagnosis not present

## 2015-12-13 DIAGNOSIS — I2510 Atherosclerotic heart disease of native coronary artery without angina pectoris: Secondary | ICD-10-CM | POA: Diagnosis not present

## 2015-12-15 DIAGNOSIS — Z471 Aftercare following joint replacement surgery: Secondary | ICD-10-CM | POA: Diagnosis not present

## 2015-12-15 DIAGNOSIS — H3530 Unspecified macular degeneration: Secondary | ICD-10-CM | POA: Diagnosis not present

## 2015-12-15 DIAGNOSIS — Z951 Presence of aortocoronary bypass graft: Secondary | ICD-10-CM | POA: Diagnosis not present

## 2015-12-15 DIAGNOSIS — I2510 Atherosclerotic heart disease of native coronary artery without angina pectoris: Secondary | ICD-10-CM | POA: Diagnosis not present

## 2015-12-15 DIAGNOSIS — R269 Unspecified abnormalities of gait and mobility: Secondary | ICD-10-CM | POA: Diagnosis not present

## 2015-12-15 DIAGNOSIS — I252 Old myocardial infarction: Secondary | ICD-10-CM | POA: Diagnosis not present

## 2015-12-15 DIAGNOSIS — Z96641 Presence of right artificial hip joint: Secondary | ICD-10-CM | POA: Diagnosis not present

## 2015-12-15 DIAGNOSIS — E785 Hyperlipidemia, unspecified: Secondary | ICD-10-CM | POA: Diagnosis not present

## 2015-12-18 DIAGNOSIS — Z96641 Presence of right artificial hip joint: Secondary | ICD-10-CM | POA: Diagnosis not present

## 2015-12-18 DIAGNOSIS — I252 Old myocardial infarction: Secondary | ICD-10-CM | POA: Diagnosis not present

## 2015-12-18 DIAGNOSIS — H3530 Unspecified macular degeneration: Secondary | ICD-10-CM | POA: Diagnosis not present

## 2015-12-18 DIAGNOSIS — Z951 Presence of aortocoronary bypass graft: Secondary | ICD-10-CM | POA: Diagnosis not present

## 2015-12-18 DIAGNOSIS — E785 Hyperlipidemia, unspecified: Secondary | ICD-10-CM | POA: Diagnosis not present

## 2015-12-18 DIAGNOSIS — Z471 Aftercare following joint replacement surgery: Secondary | ICD-10-CM | POA: Diagnosis not present

## 2015-12-18 DIAGNOSIS — I2510 Atherosclerotic heart disease of native coronary artery without angina pectoris: Secondary | ICD-10-CM | POA: Diagnosis not present

## 2015-12-18 DIAGNOSIS — R269 Unspecified abnormalities of gait and mobility: Secondary | ICD-10-CM | POA: Diagnosis not present

## 2015-12-20 DIAGNOSIS — I2510 Atherosclerotic heart disease of native coronary artery without angina pectoris: Secondary | ICD-10-CM | POA: Diagnosis not present

## 2015-12-20 DIAGNOSIS — Z951 Presence of aortocoronary bypass graft: Secondary | ICD-10-CM | POA: Diagnosis not present

## 2015-12-20 DIAGNOSIS — Z471 Aftercare following joint replacement surgery: Secondary | ICD-10-CM | POA: Diagnosis not present

## 2015-12-20 DIAGNOSIS — I252 Old myocardial infarction: Secondary | ICD-10-CM | POA: Diagnosis not present

## 2015-12-20 DIAGNOSIS — E785 Hyperlipidemia, unspecified: Secondary | ICD-10-CM | POA: Diagnosis not present

## 2015-12-20 DIAGNOSIS — Z96641 Presence of right artificial hip joint: Secondary | ICD-10-CM | POA: Diagnosis not present

## 2015-12-20 DIAGNOSIS — H3530 Unspecified macular degeneration: Secondary | ICD-10-CM | POA: Diagnosis not present

## 2015-12-20 DIAGNOSIS — R269 Unspecified abnormalities of gait and mobility: Secondary | ICD-10-CM | POA: Diagnosis not present

## 2015-12-22 DIAGNOSIS — Z471 Aftercare following joint replacement surgery: Secondary | ICD-10-CM | POA: Diagnosis not present

## 2015-12-22 DIAGNOSIS — R269 Unspecified abnormalities of gait and mobility: Secondary | ICD-10-CM | POA: Diagnosis not present

## 2015-12-22 DIAGNOSIS — I252 Old myocardial infarction: Secondary | ICD-10-CM | POA: Diagnosis not present

## 2015-12-22 DIAGNOSIS — E785 Hyperlipidemia, unspecified: Secondary | ICD-10-CM | POA: Diagnosis not present

## 2015-12-22 DIAGNOSIS — H3530 Unspecified macular degeneration: Secondary | ICD-10-CM | POA: Diagnosis not present

## 2015-12-22 DIAGNOSIS — Z951 Presence of aortocoronary bypass graft: Secondary | ICD-10-CM | POA: Diagnosis not present

## 2015-12-22 DIAGNOSIS — Z96641 Presence of right artificial hip joint: Secondary | ICD-10-CM | POA: Diagnosis not present

## 2015-12-22 DIAGNOSIS — I2510 Atherosclerotic heart disease of native coronary artery without angina pectoris: Secondary | ICD-10-CM | POA: Diagnosis not present

## 2016-01-05 DIAGNOSIS — M25651 Stiffness of right hip, not elsewhere classified: Secondary | ICD-10-CM | POA: Diagnosis not present

## 2016-01-05 DIAGNOSIS — M6281 Muscle weakness (generalized): Secondary | ICD-10-CM | POA: Diagnosis not present

## 2016-01-08 DIAGNOSIS — M25651 Stiffness of right hip, not elsewhere classified: Secondary | ICD-10-CM | POA: Diagnosis not present

## 2016-01-09 DIAGNOSIS — Z96641 Presence of right artificial hip joint: Secondary | ICD-10-CM | POA: Diagnosis not present

## 2016-01-09 DIAGNOSIS — Z471 Aftercare following joint replacement surgery: Secondary | ICD-10-CM | POA: Diagnosis not present

## 2016-01-11 DIAGNOSIS — M25651 Stiffness of right hip, not elsewhere classified: Secondary | ICD-10-CM | POA: Diagnosis not present

## 2016-01-17 DIAGNOSIS — M25651 Stiffness of right hip, not elsewhere classified: Secondary | ICD-10-CM | POA: Diagnosis not present

## 2016-01-19 DIAGNOSIS — M25651 Stiffness of right hip, not elsewhere classified: Secondary | ICD-10-CM | POA: Diagnosis not present

## 2016-02-15 DIAGNOSIS — M4326 Fusion of spine, lumbar region: Secondary | ICD-10-CM | POA: Diagnosis not present

## 2016-02-15 DIAGNOSIS — Z96641 Presence of right artificial hip joint: Secondary | ICD-10-CM | POA: Diagnosis not present

## 2016-02-15 DIAGNOSIS — M545 Low back pain: Secondary | ICD-10-CM | POA: Diagnosis not present

## 2016-02-19 ENCOUNTER — Encounter: Payer: Self-pay | Admitting: Cardiology

## 2016-02-22 DIAGNOSIS — Z96641 Presence of right artificial hip joint: Secondary | ICD-10-CM | POA: Diagnosis not present

## 2016-02-22 DIAGNOSIS — Z471 Aftercare following joint replacement surgery: Secondary | ICD-10-CM | POA: Diagnosis not present

## 2016-02-29 ENCOUNTER — Encounter (INDEPENDENT_AMBULATORY_CARE_PROVIDER_SITE_OTHER): Payer: Self-pay

## 2016-02-29 ENCOUNTER — Encounter: Payer: Self-pay | Admitting: Cardiology

## 2016-02-29 ENCOUNTER — Ambulatory Visit (INDEPENDENT_AMBULATORY_CARE_PROVIDER_SITE_OTHER): Payer: Medicare Other | Admitting: Cardiology

## 2016-02-29 VITALS — BP 122/68 | HR 56 | Ht 68.0 in | Wt 167.0 lb

## 2016-02-29 DIAGNOSIS — E782 Mixed hyperlipidemia: Secondary | ICD-10-CM

## 2016-02-29 DIAGNOSIS — I2510 Atherosclerotic heart disease of native coronary artery without angina pectoris: Secondary | ICD-10-CM | POA: Diagnosis not present

## 2016-02-29 DIAGNOSIS — I10 Essential (primary) hypertension: Secondary | ICD-10-CM

## 2016-02-29 DIAGNOSIS — Z951 Presence of aortocoronary bypass graft: Secondary | ICD-10-CM

## 2016-02-29 DIAGNOSIS — I2583 Coronary atherosclerosis due to lipid rich plaque: Secondary | ICD-10-CM

## 2016-02-29 NOTE — Progress Notes (Signed)
Patient ID: John [name], male   DOB: [birth year], 81 y.o.   MRN: [id number]    Patient Name: John [name] Date of Encounter: 02/29/2016  Primary Care Provider:  Nicholos Johns, MD Primary Cardiologist:  Ena Dawley  Patient Profile  CAD  Problem List   Past Medical History:  Diagnosis Date  . CAD (coronary artery disease)   . MI (myocardial infarction) 1978  . Spinal stenosis    L4-5  . Spondylolisthesis    L4-5   Past Surgical History:  Procedure Laterality Date  . CORONARY ARTERY BYPASS GRAFT  1999   3 VESSEL  . LUMBAR LAMINECTOMY     with partial facetectomy (L2-3, L3-4, L4-5, L5-S1)   Allergies  No Known Allergies  HPI  81 year old male with h/o CAD, s/o CABG in 1998. He reports that he felt exertional right shoulder pain and chest tightness prior to the surgery. Tat resolved and he has been asymptomatic since then. He has been followed by a PCP. He is very active, walking several miles a day, working in a back yard without any limitations. He denies palpitations, syncope. He is compliant with his meds.  02/18/2014 - 1 year follow up, the patient is experiencing left sided chest pain, left arm and elbow pain that woke him up from sleep. He took 4 aspirins that relieved the pain, he still has some residual back and arm pain.  02/29/2016 - 1 year follow up, The patient states that he has been doing really well in the last year he underwent back surgery and hip replacement and he is currently pain-free and able to do all activities of daily living and thinks he enjoyed. He denies any chest pain or shortness of breath. No dizziness or palpitations or syncope. No muscle pain from pravastatin.   In 2015 he underwent stress test, exercise nuclear stress test that showed no ischemia and hypertensive response to stress that improved with initiation of amlodipine.    Home Medications  Prior to Admission medications   Medication Sig Start Date End Date Taking?  Authorizing Provider  aspirin 81 MG tablet Take 81 mg by mouth daily.   Yes Historical Provider, MD  beta carotene w/minerals (OCUVITE) tablet Take 2 tablets by mouth daily.   Yes Historical Provider, MD  fish oil-omega-3 fatty acids 1000 MG capsule Take 2 g by mouth daily.   Yes Historical Provider, MD  metoprolol succinate (TOPROL-XL) 25 MG 24 hr tablet  01/26/13  Yes Historical Provider, MD  naproxen (NAPROSYN) 375 MG tablet  01/11/13  Yes Historical Provider, MD  pravastatin (PRAVACHOL) 80 MG tablet  02/04/13  Yes Historical Provider, MD    Family History  No family history on file.  Social History  Social History   Social History  . Marital status: Single    Spouse name: N/A  . Number of children: N/A  . Years of education: N/A   Occupational History  . Not on file.   Social History Main Topics  . Smoking status: Former Smoker    Types: Cigarettes    Quit date: 11/19/1976  . Smokeless tobacco: Never Used  . Alcohol use No  . Drug use: No  . Sexual activity: Not on file   Other Topics Concern  . Not on file   Social History Narrative  . No narrative on file     Review of Systems General:  No chills, fever, night sweats or weight changes.  Cardiovascular:  No chest pain, dyspnea on  exertion, edema, orthopnea, palpitations, paroxysmal nocturnal dyspnea. Dermatological: No rash, lesions/masses Respiratory: No cough, dyspnea Urologic: No hematuria, dysuria Abdominal:   No nausea, vomiting, diarrhea, bright red blood per rectum, melena, or hematemesis Neurologic:  No visual changes, wkns, changes in mental status. All other systems reviewed and are otherwise negative except as noted above.  Physical Exam  Blood pressure 122/68, pulse (!) 56, height 5\' 8"  (1.727 m), weight 167 lb (75.8 kg).  General: Pleasant, NAD Psych: Normal affect. Neuro: Alert and oriented X 3. Moves all extremities spontaneously. HEENT: Normal  Neck: Supple without bruits or JVD. Lungs:   Resp regular and unlabored, CTA. Heart: RRR no s3, s4, or murmurs. Abdomen: Soft, non-tender, non-distended, BS + x 4.  Extremities: No clubbing, cyanosis or edema. DP/PT/Radials 2+ and equal bilaterally.  Accessory Clinical Findings  ECG - SB, 51 BPM, non-specific ST-T wave abnormalities.  ECHO: 02/15/13 Left ventricle: The cavity size was normal. Wall thickness was increased in a pattern of mild LVH. Systolic function was normal. The estimated ejection fraction was in the range of 55% to 60%. Wall motion was normal; there were no regional wall motion abnormalities. Features are consistent with a pseudonormal left ventricular filling pattern, with concomitant abnormal relaxation and increased filling pressure (grade 2 diastolic dysfunction).  Nuclear stress test 01/2014 Impression Exercise Capacity:  Fair exercise capacity. BP Response:  Hypertensive blood pressure response. Clinical Symptoms:  No significant symptoms noted. ECG Impression:  No significant ST segment change suggestive of ischemia. Comparison with Prior Nuclear Study: No images to compare  Overall Impression:  Normal stress nuclear study. There is no scar or ischemia. Low risk scan. There is a hypertensive response to stress. LV Ejection Fraction: 57%.  LV Wall Motion:  Normal Wall Motion.    Assessment & Plan  81 year old male   1. CAD status post CABG in 1999 - the patient is asymptomatic, Negative stress test in 2015 with hypertensive response to stress, this was improved with amlodipine. Asymptomatic since then. We will continue BB, aspirin, statin.   2. Hypertension - controlled after we increasing amlodipine to 5 mg po daily the last year.   3. Hyperlipidemia - followed by his PCP, based on patient they were all WNL. Continue pravachol 80 mg QHS.   Follow up in 1 year.  Ena Dawley, MD 02/29/2016, 12:27 PM

## 2016-02-29 NOTE — Patient Instructions (Signed)

## 2016-03-04 DIAGNOSIS — H6120 Impacted cerumen, unspecified ear: Secondary | ICD-10-CM | POA: Diagnosis not present

## 2016-03-04 DIAGNOSIS — E559 Vitamin D deficiency, unspecified: Secondary | ICD-10-CM | POA: Diagnosis not present

## 2016-03-04 DIAGNOSIS — Z23 Encounter for immunization: Secondary | ICD-10-CM | POA: Diagnosis not present

## 2016-03-04 DIAGNOSIS — Z79899 Other long term (current) drug therapy: Secondary | ICD-10-CM | POA: Diagnosis not present

## 2016-03-04 DIAGNOSIS — I10 Essential (primary) hypertension: Secondary | ICD-10-CM | POA: Diagnosis not present

## 2016-03-04 DIAGNOSIS — R739 Hyperglycemia, unspecified: Secondary | ICD-10-CM | POA: Diagnosis not present

## 2016-03-04 DIAGNOSIS — J309 Allergic rhinitis, unspecified: Secondary | ICD-10-CM | POA: Diagnosis not present

## 2016-03-04 DIAGNOSIS — I2510 Atherosclerotic heart disease of native coronary artery without angina pectoris: Secondary | ICD-10-CM | POA: Diagnosis not present

## 2016-03-04 DIAGNOSIS — E785 Hyperlipidemia, unspecified: Secondary | ICD-10-CM | POA: Diagnosis not present

## 2016-05-09 DIAGNOSIS — T84020A Dislocation of internal right hip prosthesis, initial encounter: Secondary | ICD-10-CM | POA: Diagnosis not present

## 2016-05-09 DIAGNOSIS — Z951 Presence of aortocoronary bypass graft: Secondary | ICD-10-CM | POA: Diagnosis not present

## 2016-05-09 DIAGNOSIS — I252 Old myocardial infarction: Secondary | ICD-10-CM | POA: Diagnosis not present

## 2016-05-09 DIAGNOSIS — I2510 Atherosclerotic heart disease of native coronary artery without angina pectoris: Secondary | ICD-10-CM | POA: Diagnosis not present

## 2016-05-09 DIAGNOSIS — Z96641 Presence of right artificial hip joint: Secondary | ICD-10-CM | POA: Diagnosis not present

## 2016-05-09 DIAGNOSIS — S73004A Unspecified dislocation of right hip, initial encounter: Secondary | ICD-10-CM | POA: Diagnosis not present

## 2016-05-09 DIAGNOSIS — I10 Essential (primary) hypertension: Secondary | ICD-10-CM | POA: Diagnosis not present

## 2016-05-09 DIAGNOSIS — Z87891 Personal history of nicotine dependence: Secondary | ICD-10-CM | POA: Diagnosis not present

## 2016-05-22 DIAGNOSIS — Z96641 Presence of right artificial hip joint: Secondary | ICD-10-CM | POA: Diagnosis not present

## 2016-05-22 DIAGNOSIS — Z471 Aftercare following joint replacement surgery: Secondary | ICD-10-CM | POA: Diagnosis not present

## 2016-06-20 DIAGNOSIS — I10 Essential (primary) hypertension: Secondary | ICD-10-CM | POA: Diagnosis not present

## 2016-06-20 DIAGNOSIS — S61411A Laceration without foreign body of right hand, initial encounter: Secondary | ICD-10-CM | POA: Diagnosis not present

## 2016-06-20 DIAGNOSIS — E559 Vitamin D deficiency, unspecified: Secondary | ICD-10-CM | POA: Diagnosis not present

## 2016-06-20 DIAGNOSIS — J309 Allergic rhinitis, unspecified: Secondary | ICD-10-CM | POA: Diagnosis not present

## 2016-06-20 DIAGNOSIS — E785 Hyperlipidemia, unspecified: Secondary | ICD-10-CM | POA: Diagnosis not present

## 2016-08-08 DIAGNOSIS — I10 Essential (primary) hypertension: Secondary | ICD-10-CM | POA: Diagnosis not present

## 2016-08-08 DIAGNOSIS — I2510 Atherosclerotic heart disease of native coronary artery without angina pectoris: Secondary | ICD-10-CM | POA: Diagnosis not present

## 2016-08-08 DIAGNOSIS — S73005A Unspecified dislocation of left hip, initial encounter: Secondary | ICD-10-CM | POA: Diagnosis not present

## 2016-08-08 DIAGNOSIS — S73004A Unspecified dislocation of right hip, initial encounter: Secondary | ICD-10-CM | POA: Diagnosis not present

## 2016-08-08 DIAGNOSIS — M48061 Spinal stenosis, lumbar region without neurogenic claudication: Secondary | ICD-10-CM | POA: Diagnosis not present

## 2016-08-08 DIAGNOSIS — T84020A Dislocation of internal right hip prosthesis, initial encounter: Secondary | ICD-10-CM | POA: Diagnosis not present

## 2016-08-08 DIAGNOSIS — E785 Hyperlipidemia, unspecified: Secondary | ICD-10-CM | POA: Diagnosis not present

## 2016-08-08 DIAGNOSIS — I252 Old myocardial infarction: Secondary | ICD-10-CM | POA: Diagnosis not present

## 2016-08-08 DIAGNOSIS — S79911A Unspecified injury of right hip, initial encounter: Secondary | ICD-10-CM | POA: Diagnosis not present

## 2016-08-08 DIAGNOSIS — J45909 Unspecified asthma, uncomplicated: Secondary | ICD-10-CM | POA: Diagnosis not present

## 2016-08-08 DIAGNOSIS — M4716 Other spondylosis with myelopathy, lumbar region: Secondary | ICD-10-CM | POA: Diagnosis not present

## 2016-08-08 DIAGNOSIS — Z951 Presence of aortocoronary bypass graft: Secondary | ICD-10-CM | POA: Diagnosis not present

## 2016-08-13 DIAGNOSIS — M4326 Fusion of spine, lumbar region: Secondary | ICD-10-CM | POA: Diagnosis not present

## 2016-08-13 DIAGNOSIS — M545 Low back pain: Secondary | ICD-10-CM | POA: Diagnosis not present

## 2016-08-23 DIAGNOSIS — T84028A Dislocation of other internal joint prosthesis, initial encounter: Secondary | ICD-10-CM | POA: Diagnosis not present

## 2016-08-23 DIAGNOSIS — Z96649 Presence of unspecified artificial hip joint: Secondary | ICD-10-CM | POA: Diagnosis not present

## 2016-09-14 DIAGNOSIS — D1801 Hemangioma of skin and subcutaneous tissue: Secondary | ICD-10-CM | POA: Diagnosis not present

## 2016-09-14 DIAGNOSIS — L570 Actinic keratosis: Secondary | ICD-10-CM | POA: Diagnosis not present

## 2016-09-17 DIAGNOSIS — Z471 Aftercare following joint replacement surgery: Secondary | ICD-10-CM | POA: Diagnosis not present

## 2016-09-17 DIAGNOSIS — Z96641 Presence of right artificial hip joint: Secondary | ICD-10-CM | POA: Diagnosis not present

## 2016-09-17 DIAGNOSIS — S73004D Unspecified dislocation of right hip, subsequent encounter: Secondary | ICD-10-CM | POA: Diagnosis not present

## 2016-10-04 ENCOUNTER — Telehealth: Payer: Self-pay | Admitting: Cardiology

## 2016-10-04 ENCOUNTER — Other Ambulatory Visit: Payer: Self-pay | Admitting: *Deleted

## 2016-10-04 MED ORDER — METOPROLOL SUCCINATE ER 25 MG PO TB24: 25.0000 mg | ORAL_TABLET | Freq: Every day | ORAL | 1 refills | Status: DC

## 2016-10-04 NOTE — Telephone Encounter (Signed)
New message      *STAT* If patient is at the pharmacy, call can be transferred to refill team.   1. Which medications need to be refilled? (please list name of each medication and dose if known)  metoprolol succinate (TOPROL-XL) 25 MG 24 hr tablet Take 1 tablet (25 mg total) by mouth daily     2. Which pharmacy/location (including street and city if local pharmacy) is medication to be sent to? [facility] on [address] [place]  3. Do they need a 30 day or 90 day supply?  [place]

## 2016-10-21 DIAGNOSIS — I2510 Atherosclerotic heart disease of native coronary artery without angina pectoris: Secondary | ICD-10-CM | POA: Diagnosis not present

## 2016-10-21 DIAGNOSIS — E559 Vitamin D deficiency, unspecified: Secondary | ICD-10-CM | POA: Diagnosis not present

## 2016-10-21 DIAGNOSIS — I10 Essential (primary) hypertension: Secondary | ICD-10-CM | POA: Diagnosis not present

## 2016-10-21 DIAGNOSIS — M1990 Unspecified osteoarthritis, unspecified site: Secondary | ICD-10-CM | POA: Diagnosis not present

## 2016-10-21 DIAGNOSIS — Z79899 Other long term (current) drug therapy: Secondary | ICD-10-CM | POA: Diagnosis not present

## 2016-10-21 DIAGNOSIS — E785 Hyperlipidemia, unspecified: Secondary | ICD-10-CM | POA: Diagnosis not present

## 2017-01-03 DIAGNOSIS — Z951 Presence of aortocoronary bypass graft: Secondary | ICD-10-CM | POA: Diagnosis not present

## 2017-01-03 DIAGNOSIS — I2510 Atherosclerotic heart disease of native coronary artery without angina pectoris: Secondary | ICD-10-CM | POA: Diagnosis not present

## 2017-01-03 DIAGNOSIS — I252 Old myocardial infarction: Secondary | ICD-10-CM | POA: Diagnosis not present

## 2017-01-03 DIAGNOSIS — T84020A Dislocation of internal right hip prosthesis, initial encounter: Secondary | ICD-10-CM | POA: Diagnosis not present

## 2017-01-03 DIAGNOSIS — Z981 Arthrodesis status: Secondary | ICD-10-CM | POA: Diagnosis not present

## 2017-01-03 DIAGNOSIS — I10 Essential (primary) hypertension: Secondary | ICD-10-CM | POA: Diagnosis not present

## 2017-01-03 DIAGNOSIS — Z7982 Long term (current) use of aspirin: Secondary | ICD-10-CM | POA: Diagnosis not present

## 2017-01-03 DIAGNOSIS — Z7951 Long term (current) use of inhaled steroids: Secondary | ICD-10-CM | POA: Diagnosis not present

## 2017-01-03 DIAGNOSIS — E785 Hyperlipidemia, unspecified: Secondary | ICD-10-CM | POA: Diagnosis not present

## 2017-01-03 DIAGNOSIS — S73004A Unspecified dislocation of right hip, initial encounter: Secondary | ICD-10-CM | POA: Diagnosis not present

## 2017-01-03 DIAGNOSIS — M24451 Recurrent dislocation, right hip: Secondary | ICD-10-CM | POA: Diagnosis not present

## 2017-01-03 DIAGNOSIS — Z96641 Presence of right artificial hip joint: Secondary | ICD-10-CM | POA: Diagnosis not present

## 2017-01-03 DIAGNOSIS — J45909 Unspecified asthma, uncomplicated: Secondary | ICD-10-CM | POA: Diagnosis not present

## 2017-01-03 DIAGNOSIS — S73001A Unspecified subluxation of right hip, initial encounter: Secondary | ICD-10-CM | POA: Diagnosis not present

## 2017-01-03 DIAGNOSIS — Z87891 Personal history of nicotine dependence: Secondary | ICD-10-CM | POA: Diagnosis not present

## 2017-01-07 DIAGNOSIS — S73004D Unspecified dislocation of right hip, subsequent encounter: Secondary | ICD-10-CM | POA: Diagnosis not present

## 2017-01-10 ENCOUNTER — Telehealth: Payer: Self-pay | Admitting: *Deleted

## 2017-01-10 NOTE — Telephone Encounter (Signed)
Faxed to Dr. Jene Every office @ [phone] that pt is cleared for surgery for right total hip revision.

## 2017-01-21 DIAGNOSIS — R9431 Abnormal electrocardiogram [ECG] [EKG]: Secondary | ICD-10-CM | POA: Diagnosis not present

## 2017-01-21 DIAGNOSIS — Z01818 Encounter for other preprocedural examination: Secondary | ICD-10-CM | POA: Diagnosis not present

## 2017-01-21 DIAGNOSIS — R001 Bradycardia, unspecified: Secondary | ICD-10-CM | POA: Diagnosis not present

## 2017-01-21 DIAGNOSIS — R918 Other nonspecific abnormal finding of lung field: Secondary | ICD-10-CM | POA: Diagnosis not present

## 2017-01-21 DIAGNOSIS — Z79899 Other long term (current) drug therapy: Secondary | ICD-10-CM | POA: Diagnosis not present

## 2017-01-21 DIAGNOSIS — Z96641 Presence of right artificial hip joint: Secondary | ICD-10-CM | POA: Diagnosis not present

## 2017-01-21 DIAGNOSIS — T84020D Dislocation of internal right hip prosthesis, subsequent encounter: Secondary | ICD-10-CM | POA: Diagnosis not present

## 2017-01-27 DIAGNOSIS — Z96641 Presence of right artificial hip joint: Secondary | ICD-10-CM | POA: Diagnosis not present

## 2017-01-27 DIAGNOSIS — Z951 Presence of aortocoronary bypass graft: Secondary | ICD-10-CM | POA: Diagnosis not present

## 2017-01-27 DIAGNOSIS — Z7982 Long term (current) use of aspirin: Secondary | ICD-10-CM | POA: Diagnosis not present

## 2017-01-27 DIAGNOSIS — Z79899 Other long term (current) drug therapy: Secondary | ICD-10-CM | POA: Diagnosis not present

## 2017-01-27 DIAGNOSIS — T84020A Dislocation of internal right hip prosthesis, initial encounter: Secondary | ICD-10-CM | POA: Diagnosis not present

## 2017-01-27 DIAGNOSIS — Z87891 Personal history of nicotine dependence: Secondary | ICD-10-CM | POA: Diagnosis not present

## 2017-01-27 DIAGNOSIS — I2510 Atherosclerotic heart disease of native coronary artery without angina pectoris: Secondary | ICD-10-CM | POA: Diagnosis not present

## 2017-01-27 DIAGNOSIS — E785 Hyperlipidemia, unspecified: Secondary | ICD-10-CM | POA: Diagnosis not present

## 2017-01-27 DIAGNOSIS — I10 Essential (primary) hypertension: Secondary | ICD-10-CM | POA: Diagnosis not present

## 2017-01-27 DIAGNOSIS — I252 Old myocardial infarction: Secondary | ICD-10-CM | POA: Diagnosis not present

## 2017-01-27 DIAGNOSIS — J45909 Unspecified asthma, uncomplicated: Secondary | ICD-10-CM | POA: Diagnosis not present

## 2017-01-31 DIAGNOSIS — M159 Polyosteoarthritis, unspecified: Secondary | ICD-10-CM | POA: Diagnosis not present

## 2017-01-31 DIAGNOSIS — Z8614 Personal history of Methicillin resistant Staphylococcus aureus infection: Secondary | ICD-10-CM | POA: Diagnosis not present

## 2017-01-31 DIAGNOSIS — H3530 Unspecified macular degeneration: Secondary | ICD-10-CM | POA: Diagnosis not present

## 2017-01-31 DIAGNOSIS — E782 Mixed hyperlipidemia: Secondary | ICD-10-CM | POA: Diagnosis not present

## 2017-01-31 DIAGNOSIS — M48062 Spinal stenosis, lumbar region with neurogenic claudication: Secondary | ICD-10-CM | POA: Diagnosis not present

## 2017-01-31 DIAGNOSIS — J45909 Unspecified asthma, uncomplicated: Secondary | ICD-10-CM | POA: Diagnosis not present

## 2017-01-31 DIAGNOSIS — Z471 Aftercare following joint replacement surgery: Secondary | ICD-10-CM | POA: Diagnosis not present

## 2017-01-31 DIAGNOSIS — Z87891 Personal history of nicotine dependence: Secondary | ICD-10-CM | POA: Diagnosis not present

## 2017-01-31 DIAGNOSIS — I252 Old myocardial infarction: Secondary | ICD-10-CM | POA: Diagnosis not present

## 2017-01-31 DIAGNOSIS — R269 Unspecified abnormalities of gait and mobility: Secondary | ICD-10-CM | POA: Diagnosis not present

## 2017-01-31 DIAGNOSIS — I2510 Atherosclerotic heart disease of native coronary artery without angina pectoris: Secondary | ICD-10-CM | POA: Diagnosis not present

## 2017-01-31 DIAGNOSIS — Z96641 Presence of right artificial hip joint: Secondary | ICD-10-CM | POA: Diagnosis not present

## 2017-01-31 DIAGNOSIS — I10 Essential (primary) hypertension: Secondary | ICD-10-CM | POA: Diagnosis not present

## 2017-01-31 DIAGNOSIS — Z951 Presence of aortocoronary bypass graft: Secondary | ICD-10-CM | POA: Diagnosis not present

## 2017-01-31 DIAGNOSIS — M25551 Pain in right hip: Secondary | ICD-10-CM | POA: Diagnosis not present

## 2017-02-03 DIAGNOSIS — I10 Essential (primary) hypertension: Secondary | ICD-10-CM | POA: Diagnosis not present

## 2017-02-03 DIAGNOSIS — I2510 Atherosclerotic heart disease of native coronary artery without angina pectoris: Secondary | ICD-10-CM | POA: Diagnosis not present

## 2017-02-03 DIAGNOSIS — Z8614 Personal history of Methicillin resistant Staphylococcus aureus infection: Secondary | ICD-10-CM | POA: Diagnosis not present

## 2017-02-03 DIAGNOSIS — Z96641 Presence of right artificial hip joint: Secondary | ICD-10-CM | POA: Diagnosis not present

## 2017-02-03 DIAGNOSIS — I252 Old myocardial infarction: Secondary | ICD-10-CM | POA: Diagnosis not present

## 2017-02-03 DIAGNOSIS — R269 Unspecified abnormalities of gait and mobility: Secondary | ICD-10-CM | POA: Diagnosis not present

## 2017-02-03 DIAGNOSIS — Z471 Aftercare following joint replacement surgery: Secondary | ICD-10-CM | POA: Diagnosis not present

## 2017-02-03 DIAGNOSIS — H3530 Unspecified macular degeneration: Secondary | ICD-10-CM | POA: Diagnosis not present

## 2017-02-03 DIAGNOSIS — J45909 Unspecified asthma, uncomplicated: Secondary | ICD-10-CM | POA: Diagnosis not present

## 2017-02-03 DIAGNOSIS — M25551 Pain in right hip: Secondary | ICD-10-CM | POA: Diagnosis not present

## 2017-02-03 DIAGNOSIS — Z951 Presence of aortocoronary bypass graft: Secondary | ICD-10-CM | POA: Diagnosis not present

## 2017-02-03 DIAGNOSIS — E782 Mixed hyperlipidemia: Secondary | ICD-10-CM | POA: Diagnosis not present

## 2017-02-03 DIAGNOSIS — M159 Polyosteoarthritis, unspecified: Secondary | ICD-10-CM | POA: Diagnosis not present

## 2017-02-03 DIAGNOSIS — Z87891 Personal history of nicotine dependence: Secondary | ICD-10-CM | POA: Diagnosis not present

## 2017-02-03 DIAGNOSIS — M48062 Spinal stenosis, lumbar region with neurogenic claudication: Secondary | ICD-10-CM | POA: Diagnosis not present

## 2017-02-05 DIAGNOSIS — Z471 Aftercare following joint replacement surgery: Secondary | ICD-10-CM | POA: Diagnosis not present

## 2017-02-05 DIAGNOSIS — R269 Unspecified abnormalities of gait and mobility: Secondary | ICD-10-CM | POA: Diagnosis not present

## 2017-02-05 DIAGNOSIS — H3530 Unspecified macular degeneration: Secondary | ICD-10-CM | POA: Diagnosis not present

## 2017-02-05 DIAGNOSIS — I252 Old myocardial infarction: Secondary | ICD-10-CM | POA: Diagnosis not present

## 2017-02-05 DIAGNOSIS — Z8614 Personal history of Methicillin resistant Staphylococcus aureus infection: Secondary | ICD-10-CM | POA: Diagnosis not present

## 2017-02-05 DIAGNOSIS — J45909 Unspecified asthma, uncomplicated: Secondary | ICD-10-CM | POA: Diagnosis not present

## 2017-02-05 DIAGNOSIS — I10 Essential (primary) hypertension: Secondary | ICD-10-CM | POA: Diagnosis not present

## 2017-02-05 DIAGNOSIS — E782 Mixed hyperlipidemia: Secondary | ICD-10-CM | POA: Diagnosis not present

## 2017-02-05 DIAGNOSIS — Z951 Presence of aortocoronary bypass graft: Secondary | ICD-10-CM | POA: Diagnosis not present

## 2017-02-05 DIAGNOSIS — M25551 Pain in right hip: Secondary | ICD-10-CM | POA: Diagnosis not present

## 2017-02-05 DIAGNOSIS — M159 Polyosteoarthritis, unspecified: Secondary | ICD-10-CM | POA: Diagnosis not present

## 2017-02-05 DIAGNOSIS — M48062 Spinal stenosis, lumbar region with neurogenic claudication: Secondary | ICD-10-CM | POA: Diagnosis not present

## 2017-02-05 DIAGNOSIS — Z87891 Personal history of nicotine dependence: Secondary | ICD-10-CM | POA: Diagnosis not present

## 2017-02-05 DIAGNOSIS — I2510 Atherosclerotic heart disease of native coronary artery without angina pectoris: Secondary | ICD-10-CM | POA: Diagnosis not present

## 2017-02-05 DIAGNOSIS — Z96641 Presence of right artificial hip joint: Secondary | ICD-10-CM | POA: Diagnosis not present

## 2017-02-07 DIAGNOSIS — M159 Polyosteoarthritis, unspecified: Secondary | ICD-10-CM | POA: Diagnosis not present

## 2017-02-07 DIAGNOSIS — M25551 Pain in right hip: Secondary | ICD-10-CM | POA: Diagnosis not present

## 2017-02-07 DIAGNOSIS — Z96641 Presence of right artificial hip joint: Secondary | ICD-10-CM | POA: Diagnosis not present

## 2017-02-07 DIAGNOSIS — I10 Essential (primary) hypertension: Secondary | ICD-10-CM | POA: Diagnosis not present

## 2017-02-07 DIAGNOSIS — Z87891 Personal history of nicotine dependence: Secondary | ICD-10-CM | POA: Diagnosis not present

## 2017-02-07 DIAGNOSIS — E782 Mixed hyperlipidemia: Secondary | ICD-10-CM | POA: Diagnosis not present

## 2017-02-07 DIAGNOSIS — Z951 Presence of aortocoronary bypass graft: Secondary | ICD-10-CM | POA: Diagnosis not present

## 2017-02-07 DIAGNOSIS — I2510 Atherosclerotic heart disease of native coronary artery without angina pectoris: Secondary | ICD-10-CM | POA: Diagnosis not present

## 2017-02-07 DIAGNOSIS — R269 Unspecified abnormalities of gait and mobility: Secondary | ICD-10-CM | POA: Diagnosis not present

## 2017-02-07 DIAGNOSIS — J45909 Unspecified asthma, uncomplicated: Secondary | ICD-10-CM | POA: Diagnosis not present

## 2017-02-07 DIAGNOSIS — Z8614 Personal history of Methicillin resistant Staphylococcus aureus infection: Secondary | ICD-10-CM | POA: Diagnosis not present

## 2017-02-07 DIAGNOSIS — H3530 Unspecified macular degeneration: Secondary | ICD-10-CM | POA: Diagnosis not present

## 2017-02-07 DIAGNOSIS — I252 Old myocardial infarction: Secondary | ICD-10-CM | POA: Diagnosis not present

## 2017-02-07 DIAGNOSIS — M48062 Spinal stenosis, lumbar region with neurogenic claudication: Secondary | ICD-10-CM | POA: Diagnosis not present

## 2017-02-07 DIAGNOSIS — Z471 Aftercare following joint replacement surgery: Secondary | ICD-10-CM | POA: Diagnosis not present

## 2017-02-10 DIAGNOSIS — Z8614 Personal history of Methicillin resistant Staphylococcus aureus infection: Secondary | ICD-10-CM | POA: Diagnosis not present

## 2017-02-10 DIAGNOSIS — E782 Mixed hyperlipidemia: Secondary | ICD-10-CM | POA: Diagnosis not present

## 2017-02-10 DIAGNOSIS — M48062 Spinal stenosis, lumbar region with neurogenic claudication: Secondary | ICD-10-CM | POA: Diagnosis not present

## 2017-02-10 DIAGNOSIS — I10 Essential (primary) hypertension: Secondary | ICD-10-CM | POA: Diagnosis not present

## 2017-02-10 DIAGNOSIS — R269 Unspecified abnormalities of gait and mobility: Secondary | ICD-10-CM | POA: Diagnosis not present

## 2017-02-10 DIAGNOSIS — H3530 Unspecified macular degeneration: Secondary | ICD-10-CM | POA: Diagnosis not present

## 2017-02-10 DIAGNOSIS — Z87891 Personal history of nicotine dependence: Secondary | ICD-10-CM | POA: Diagnosis not present

## 2017-02-10 DIAGNOSIS — M25551 Pain in right hip: Secondary | ICD-10-CM | POA: Diagnosis not present

## 2017-02-10 DIAGNOSIS — Z471 Aftercare following joint replacement surgery: Secondary | ICD-10-CM | POA: Diagnosis not present

## 2017-02-10 DIAGNOSIS — M159 Polyosteoarthritis, unspecified: Secondary | ICD-10-CM | POA: Diagnosis not present

## 2017-02-10 DIAGNOSIS — Z951 Presence of aortocoronary bypass graft: Secondary | ICD-10-CM | POA: Diagnosis not present

## 2017-02-10 DIAGNOSIS — I2510 Atherosclerotic heart disease of native coronary artery without angina pectoris: Secondary | ICD-10-CM | POA: Diagnosis not present

## 2017-02-10 DIAGNOSIS — J45909 Unspecified asthma, uncomplicated: Secondary | ICD-10-CM | POA: Diagnosis not present

## 2017-02-10 DIAGNOSIS — Z96641 Presence of right artificial hip joint: Secondary | ICD-10-CM | POA: Diagnosis not present

## 2017-02-10 DIAGNOSIS — I252 Old myocardial infarction: Secondary | ICD-10-CM | POA: Diagnosis not present

## 2017-02-13 DIAGNOSIS — I252 Old myocardial infarction: Secondary | ICD-10-CM | POA: Diagnosis not present

## 2017-02-13 DIAGNOSIS — Z951 Presence of aortocoronary bypass graft: Secondary | ICD-10-CM | POA: Diagnosis not present

## 2017-02-13 DIAGNOSIS — Z87891 Personal history of nicotine dependence: Secondary | ICD-10-CM | POA: Diagnosis not present

## 2017-02-13 DIAGNOSIS — R269 Unspecified abnormalities of gait and mobility: Secondary | ICD-10-CM | POA: Diagnosis not present

## 2017-02-13 DIAGNOSIS — Z471 Aftercare following joint replacement surgery: Secondary | ICD-10-CM | POA: Diagnosis not present

## 2017-02-13 DIAGNOSIS — I10 Essential (primary) hypertension: Secondary | ICD-10-CM | POA: Diagnosis not present

## 2017-02-13 DIAGNOSIS — M25551 Pain in right hip: Secondary | ICD-10-CM | POA: Diagnosis not present

## 2017-02-13 DIAGNOSIS — M159 Polyosteoarthritis, unspecified: Secondary | ICD-10-CM | POA: Diagnosis not present

## 2017-02-13 DIAGNOSIS — Z96641 Presence of right artificial hip joint: Secondary | ICD-10-CM | POA: Diagnosis not present

## 2017-02-13 DIAGNOSIS — E782 Mixed hyperlipidemia: Secondary | ICD-10-CM | POA: Diagnosis not present

## 2017-02-13 DIAGNOSIS — I2510 Atherosclerotic heart disease of native coronary artery without angina pectoris: Secondary | ICD-10-CM | POA: Diagnosis not present

## 2017-02-13 DIAGNOSIS — Z8614 Personal history of Methicillin resistant Staphylococcus aureus infection: Secondary | ICD-10-CM | POA: Diagnosis not present

## 2017-02-13 DIAGNOSIS — J45909 Unspecified asthma, uncomplicated: Secondary | ICD-10-CM | POA: Diagnosis not present

## 2017-02-13 DIAGNOSIS — H3530 Unspecified macular degeneration: Secondary | ICD-10-CM | POA: Diagnosis not present

## 2017-02-13 DIAGNOSIS — M48062 Spinal stenosis, lumbar region with neurogenic claudication: Secondary | ICD-10-CM | POA: Diagnosis not present

## 2017-02-18 DIAGNOSIS — E782 Mixed hyperlipidemia: Secondary | ICD-10-CM | POA: Diagnosis not present

## 2017-02-18 DIAGNOSIS — I252 Old myocardial infarction: Secondary | ICD-10-CM | POA: Diagnosis not present

## 2017-02-18 DIAGNOSIS — M48062 Spinal stenosis, lumbar region with neurogenic claudication: Secondary | ICD-10-CM | POA: Diagnosis not present

## 2017-02-18 DIAGNOSIS — M159 Polyosteoarthritis, unspecified: Secondary | ICD-10-CM | POA: Diagnosis not present

## 2017-02-18 DIAGNOSIS — I10 Essential (primary) hypertension: Secondary | ICD-10-CM | POA: Diagnosis not present

## 2017-02-18 DIAGNOSIS — Z8614 Personal history of Methicillin resistant Staphylococcus aureus infection: Secondary | ICD-10-CM | POA: Diagnosis not present

## 2017-02-18 DIAGNOSIS — Z471 Aftercare following joint replacement surgery: Secondary | ICD-10-CM | POA: Diagnosis not present

## 2017-02-18 DIAGNOSIS — H3530 Unspecified macular degeneration: Secondary | ICD-10-CM | POA: Diagnosis not present

## 2017-02-18 DIAGNOSIS — Z951 Presence of aortocoronary bypass graft: Secondary | ICD-10-CM | POA: Diagnosis not present

## 2017-02-18 DIAGNOSIS — R269 Unspecified abnormalities of gait and mobility: Secondary | ICD-10-CM | POA: Diagnosis not present

## 2017-02-18 DIAGNOSIS — Z87891 Personal history of nicotine dependence: Secondary | ICD-10-CM | POA: Diagnosis not present

## 2017-02-18 DIAGNOSIS — J45909 Unspecified asthma, uncomplicated: Secondary | ICD-10-CM | POA: Diagnosis not present

## 2017-02-18 DIAGNOSIS — I2510 Atherosclerotic heart disease of native coronary artery without angina pectoris: Secondary | ICD-10-CM | POA: Diagnosis not present

## 2017-02-18 DIAGNOSIS — M25551 Pain in right hip: Secondary | ICD-10-CM | POA: Diagnosis not present

## 2017-02-18 DIAGNOSIS — Z96641 Presence of right artificial hip joint: Secondary | ICD-10-CM | POA: Diagnosis not present

## 2017-02-20 DIAGNOSIS — Z23 Encounter for immunization: Secondary | ICD-10-CM | POA: Diagnosis not present

## 2017-02-20 DIAGNOSIS — Z9181 History of falling: Secondary | ICD-10-CM | POA: Diagnosis not present

## 2017-02-20 DIAGNOSIS — I10 Essential (primary) hypertension: Secondary | ICD-10-CM | POA: Diagnosis not present

## 2017-02-20 DIAGNOSIS — Z1389 Encounter for screening for other disorder: Secondary | ICD-10-CM | POA: Diagnosis not present

## 2017-02-20 DIAGNOSIS — I2510 Atherosclerotic heart disease of native coronary artery without angina pectoris: Secondary | ICD-10-CM | POA: Diagnosis not present

## 2017-02-20 DIAGNOSIS — E785 Hyperlipidemia, unspecified: Secondary | ICD-10-CM | POA: Diagnosis not present

## 2017-02-20 DIAGNOSIS — Z139 Encounter for screening, unspecified: Secondary | ICD-10-CM | POA: Diagnosis not present

## 2017-02-25 DIAGNOSIS — Z96641 Presence of right artificial hip joint: Secondary | ICD-10-CM | POA: Diagnosis not present

## 2017-02-25 DIAGNOSIS — Z471 Aftercare following joint replacement surgery: Secondary | ICD-10-CM | POA: Diagnosis not present

## 2017-03-04 DIAGNOSIS — Z471 Aftercare following joint replacement surgery: Secondary | ICD-10-CM | POA: Diagnosis not present

## 2017-03-04 DIAGNOSIS — R2689 Other abnormalities of gait and mobility: Secondary | ICD-10-CM | POA: Diagnosis not present

## 2017-03-04 DIAGNOSIS — M6281 Muscle weakness (generalized): Secondary | ICD-10-CM | POA: Diagnosis not present

## 2017-03-04 DIAGNOSIS — Z96641 Presence of right artificial hip joint: Secondary | ICD-10-CM | POA: Diagnosis not present

## 2017-03-06 DIAGNOSIS — R2689 Other abnormalities of gait and mobility: Secondary | ICD-10-CM | POA: Diagnosis not present

## 2017-03-06 DIAGNOSIS — M6281 Muscle weakness (generalized): Secondary | ICD-10-CM | POA: Diagnosis not present

## 2017-03-06 DIAGNOSIS — Z471 Aftercare following joint replacement surgery: Secondary | ICD-10-CM | POA: Diagnosis not present

## 2017-03-06 DIAGNOSIS — Z96641 Presence of right artificial hip joint: Secondary | ICD-10-CM | POA: Diagnosis not present

## 2017-03-10 DIAGNOSIS — Z471 Aftercare following joint replacement surgery: Secondary | ICD-10-CM | POA: Diagnosis not present

## 2017-03-10 DIAGNOSIS — Z96641 Presence of right artificial hip joint: Secondary | ICD-10-CM | POA: Diagnosis not present

## 2017-03-10 DIAGNOSIS — R2689 Other abnormalities of gait and mobility: Secondary | ICD-10-CM | POA: Diagnosis not present

## 2017-03-10 DIAGNOSIS — M6281 Muscle weakness (generalized): Secondary | ICD-10-CM | POA: Diagnosis not present

## 2017-03-13 DIAGNOSIS — Z471 Aftercare following joint replacement surgery: Secondary | ICD-10-CM | POA: Diagnosis not present

## 2017-03-13 DIAGNOSIS — R2689 Other abnormalities of gait and mobility: Secondary | ICD-10-CM | POA: Diagnosis not present

## 2017-03-13 DIAGNOSIS — Z96641 Presence of right artificial hip joint: Secondary | ICD-10-CM | POA: Diagnosis not present

## 2017-03-13 DIAGNOSIS — M6281 Muscle weakness (generalized): Secondary | ICD-10-CM | POA: Diagnosis not present

## 2017-03-17 DIAGNOSIS — R2689 Other abnormalities of gait and mobility: Secondary | ICD-10-CM | POA: Diagnosis not present

## 2017-03-17 DIAGNOSIS — Z96641 Presence of right artificial hip joint: Secondary | ICD-10-CM | POA: Diagnosis not present

## 2017-03-17 DIAGNOSIS — Z471 Aftercare following joint replacement surgery: Secondary | ICD-10-CM | POA: Diagnosis not present

## 2017-03-17 DIAGNOSIS — M6281 Muscle weakness (generalized): Secondary | ICD-10-CM | POA: Diagnosis not present

## 2017-03-20 DIAGNOSIS — R2689 Other abnormalities of gait and mobility: Secondary | ICD-10-CM | POA: Diagnosis not present

## 2017-03-20 DIAGNOSIS — Z96641 Presence of right artificial hip joint: Secondary | ICD-10-CM | POA: Diagnosis not present

## 2017-03-20 DIAGNOSIS — M6281 Muscle weakness (generalized): Secondary | ICD-10-CM | POA: Diagnosis not present

## 2017-03-20 DIAGNOSIS — Z471 Aftercare following joint replacement surgery: Secondary | ICD-10-CM | POA: Diagnosis not present

## 2017-03-28 DIAGNOSIS — H0014 Chalazion left upper eyelid: Secondary | ICD-10-CM | POA: Diagnosis not present

## 2017-04-02 DIAGNOSIS — H0014 Chalazion left upper eyelid: Secondary | ICD-10-CM | POA: Diagnosis not present

## 2017-04-14 DIAGNOSIS — H2513 Age-related nuclear cataract, bilateral: Secondary | ICD-10-CM | POA: Diagnosis not present

## 2017-04-14 DIAGNOSIS — H353134 Nonexudative age-related macular degeneration, bilateral, advanced atrophic with subfoveal involvement: Secondary | ICD-10-CM | POA: Diagnosis not present

## 2017-04-14 DIAGNOSIS — H524 Presbyopia: Secondary | ICD-10-CM | POA: Diagnosis not present

## 2017-05-13 ENCOUNTER — Other Ambulatory Visit: Payer: Self-pay | Admitting: Cardiology

## 2017-05-16 ENCOUNTER — Ambulatory Visit: Payer: Medicare Other | Admitting: Cardiology

## 2017-05-16 ENCOUNTER — Encounter: Payer: Self-pay | Admitting: Cardiology

## 2017-05-16 VITALS — BP 142/70 | HR 62 | Ht 68.0 in | Wt 167.4 lb

## 2017-05-16 DIAGNOSIS — E782 Mixed hyperlipidemia: Secondary | ICD-10-CM | POA: Diagnosis not present

## 2017-05-16 DIAGNOSIS — I2510 Atherosclerotic heart disease of native coronary artery without angina pectoris: Secondary | ICD-10-CM | POA: Diagnosis not present

## 2017-05-16 DIAGNOSIS — Z951 Presence of aortocoronary bypass graft: Secondary | ICD-10-CM | POA: Diagnosis not present

## 2017-05-16 DIAGNOSIS — I10 Essential (primary) hypertension: Secondary | ICD-10-CM | POA: Diagnosis not present

## 2017-05-16 DIAGNOSIS — I2583 Coronary atherosclerosis due to lipid rich plaque: Secondary | ICD-10-CM

## 2017-05-16 NOTE — Progress Notes (Signed)
Patient ID: John [name], male   DOB: 1935-04-01, 83 y.o.   MRN: [id number]    Patient Name: John [name] Date of Encounter: 05/16/2017  Primary Care Provider:  Nicholos Johns, MD Primary Cardiologist:  Ena Dawley  Patient Profile  CAD  Problem List   Past Medical History:  Diagnosis Date  . CAD (coronary artery disease)   . MI (myocardial infarction) ([place]) 1978  . Spinal stenosis    L4-5  . Spondylolisthesis    L4-5   Past Surgical History:  Procedure Laterality Date  . CORONARY ARTERY BYPASS GRAFT  1999   3 VESSEL  . LUMBAR LAMINECTOMY     with partial facetectomy (L2-3, L3-4, L4-5, L5-S1)   Allergies  No Known Allergies  HPI  83 year old male with h/o CAD, s/o CABG in 1998. He reports that he felt exertional right shoulder pain and chest tightness prior to the surgery. Tat resolved and he has been asymptomatic since then. He has been followed by a PCP. He is very active, walking several miles a day, working in a back yard without any limitations. He denies palpitations, syncope. He is compliant with his meds.  02/18/2014 - 1 year follow up, the patient is experiencing left sided chest pain, left arm and elbow pain that woke him up from sleep. He took 4 aspirins that relieved the pain, he still has some residual back and arm pain.  02/29/2016 - 1 year follow up, The patient states that he has been doing really well in the last year he underwent back surgery and hip replacement and he is currently pain-free and able to do all activities of daily living and thinks he enjoyed. He denies any chest pain or shortness of breath. No dizziness or palpitations or syncope. No muscle pain from pravastatin.   In 2015 he underwent stress test, exercise nuclear stress test that showed no ischemia and hypertensive response to stress that improved with initiation of amlodipine.   05/16/2017 - the patient is coming after one year, he and his wife have a very busy life, he is very  active around his house and denies any chest pain shortness of breath no palpitations claudications dizziness or syncope. In the last year he underwent hip replacement twice as the first one was dysfunctional. He had no complications during the surgery. The couple is very happy today appear celebrated their 63th wedding anniversary. He has been compliant with his medication and has no side effects.   Home Medications  Prior to Admission medications   Medication Sig Start Date End Date Taking? Authorizing Provider  aspirin 81 MG tablet Take 81 mg by mouth daily.   Yes Historical Provider, MD  beta carotene w/minerals (OCUVITE) tablet Take 2 tablets by mouth daily.   Yes Historical Provider, MD  fish oil-omega-3 fatty acids 1000 MG capsule Take 2 g by mouth daily.   Yes Historical Provider, MD  metoprolol succinate (TOPROL-XL) 25 MG 24 hr tablet  01/26/13  Yes Historical Provider, MD  naproxen (NAPROSYN) 375 MG tablet  01/11/13  Yes Historical Provider, MD  pravastatin (PRAVACHOL) 80 MG tablet  02/04/13  Yes Historical Provider, MD    Family History  History reviewed. No pertinent family history.  Social History  Social History   Socioeconomic History  . Marital status: Single    Spouse name: Not on file  . Number of children: Not on file  . Years of education: Not on file  . Highest education level: Not  on file  Social Needs  . Financial resource strain: Not on file  . Food insecurity - worry: Not on file  . Food insecurity - inability: Not on file  . Transportation needs - medical: Not on file  . Transportation needs - non-medical: Not on file  Occupational History  . Not on file  Tobacco Use  . Smoking status: Former Smoker    Types: Cigarettes    Last attempt to quit: 11/19/1976    Years since quitting: 40.5  . Smokeless tobacco: Never Used  Substance and Sexual Activity  . Alcohol use: No  . Drug use: No  . Sexual activity: Not on file  Other Topics Concern  . Not on  file  Social History Narrative  . Not on file     Review of Systems General:  No chills, fever, night sweats or weight changes.  Cardiovascular:  No chest pain, dyspnea on exertion, edema, orthopnea, palpitations, paroxysmal nocturnal dyspnea. Dermatological: No rash, lesions/masses Respiratory: No cough, dyspnea Urologic: No hematuria, dysuria Abdominal:   No nausea, vomiting, diarrhea, bright red blood per rectum, melena, or hematemesis Neurologic:  No visual changes, wkns, changes in mental status. All other systems reviewed and are otherwise negative except as noted above.  Physical Exam  Blood pressure (!) 142/70, pulse 62, height 5\' 8"  (1.727 m), weight 167 lb 6.4 oz (75.9 kg), SpO2 96 %.  General: Pleasant, NAD Psych: Normal affect. Neuro: Alert and oriented X 3. Moves all extremities spontaneously. HEENT: Normal  Neck: Supple without bruits or JVD. Lungs:  Resp regular and unlabored, CTA. Heart: RRR no s3, s4, or murmurs. Abdomen: Soft, non-tender, non-distended, BS + x 4.  Extremities: No clubbing, cyanosis or edema. DP/PT/Radials 2+ and equal bilaterally.  Accessory Clinical Findings  ECG - performed today 05/16/2017 shows normal sinus rhythm, normal EKG unchanged from prior. This was personally reviewed.  ECHO: 02/15/13 Left ventricle: The cavity size was normal. Wall thickness was increased in a pattern of mild LVH. Systolic function was normal. The estimated ejection fraction was in the range of 55% to 60%. Wall motion was normal; there were no regional wall motion abnormalities. Features are consistent with a pseudonormal left ventricular filling pattern, with concomitant abnormal relaxation and increased filling pressure (grade 2 diastolic dysfunction).  Nuclear stress test 01/2014 Impression Exercise Capacity:  Fair exercise capacity. BP Response:  Hypertensive blood pressure response. Clinical Symptoms:  No significant symptoms noted. ECG Impression:  No  significant ST segment change suggestive of ischemia. Comparison with Prior Nuclear Study: No images to compare  Overall Impression:  Normal stress nuclear study. There is no scar or ischemia. Low risk scan. There is a hypertensive response to stress. LV Ejection Fraction: 57%.  LV Wall Motion:  Normal Wall Motion.   Assessment & Plan  1. CAD status post CABG in 1999 - the patient is asymptomatic, very active constantly on the move with Negative stress test in 2015 with hypertensive response to stress, no symptoms change since then , this was improved with amlodipine. We will continue BB, aspirin, statin. History well.  2. Hypertension - controlled after we increasing amlodipine to 5 mg po daily, repeated blood pressure today 137/75, he states that at home it's always in 130s.   3. Hyperlipidemia - followed by his PCP, based on patient they were all WNL. Continue pravachol 80 mg QHS. History tolerating it well.   Follow up in 1 year.  Ena Dawley, MD 05/16/2017, 2:24 PM

## 2017-05-16 NOTE — Patient Instructions (Signed)

## 2017-06-29 IMAGING — MR MR LUMBAR SPINE W/O CM
4 of 5 series · 30 of 48 positions shown · non-contrast
Comparison: Plain films lumbar spine 02/23/2015 and 12/23/2005.

CLINICAL DATA: Right side low back pain for 3-4 months. History of
surgery in 1441.

EXAM:
MRI LUMBAR SPINE WITHOUT CONTRAST
TECHNIQUE: Multiplanar, multisequence MR imaging of the lumbar spine was
performed. No intravenous contrast was administered.

[Series 2: T2 · sagittal · 4.0mm · 0.44mm/px · 5 of 14 slices shown (1 of 2)]
[im 1/14]
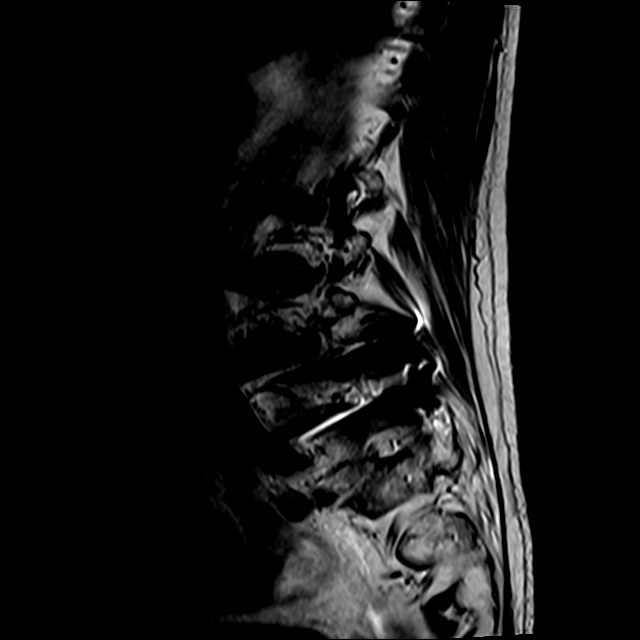
[im 4/14]
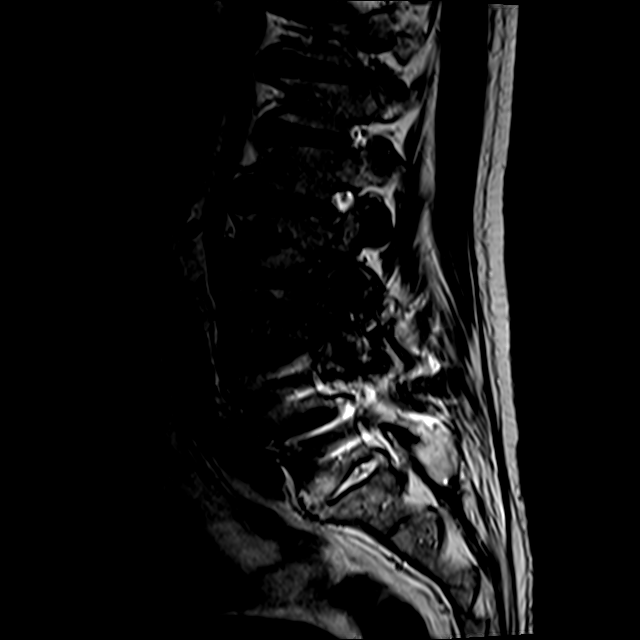
[im 7/14]
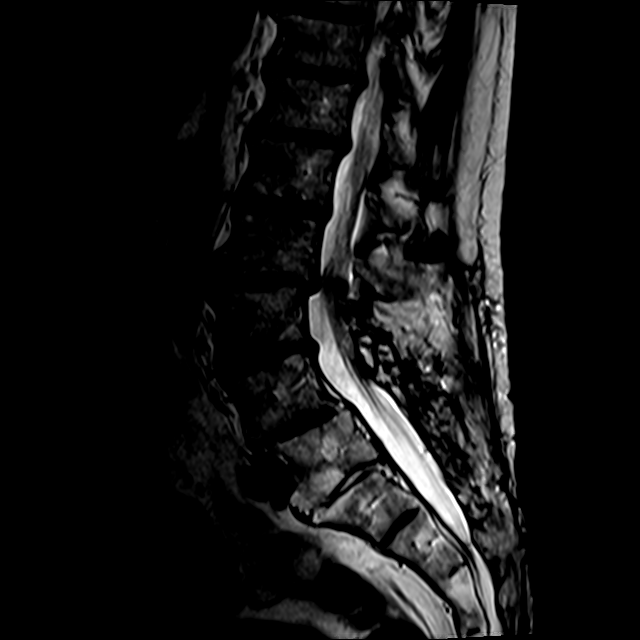
[im 10/14]
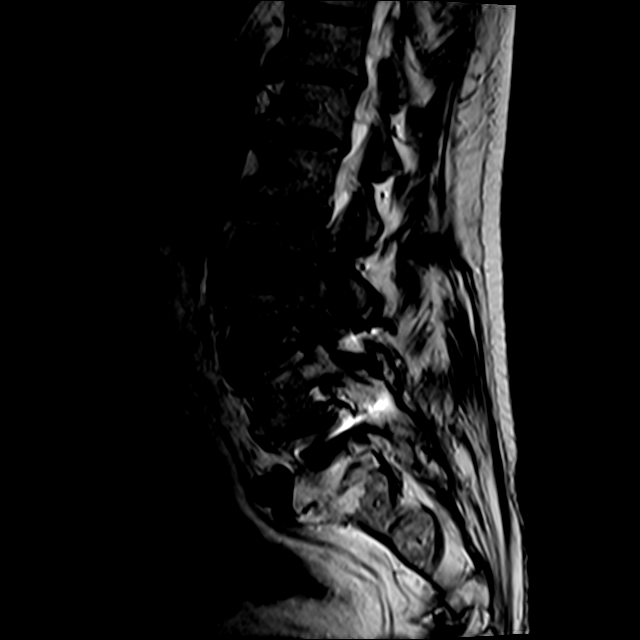
[im 14/14]
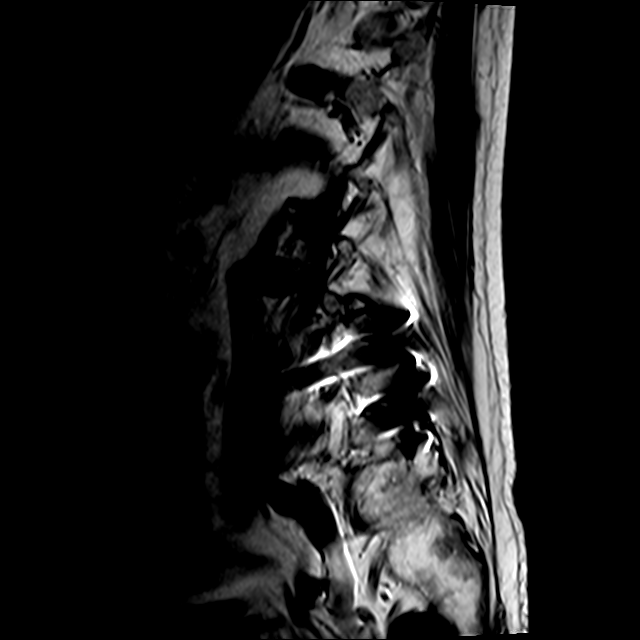

[Series 3: T1 · sagittal · 4.0mm · 0.55mm/px · 5 of 14 slices shown (1 of 2)]
[im 1/14]
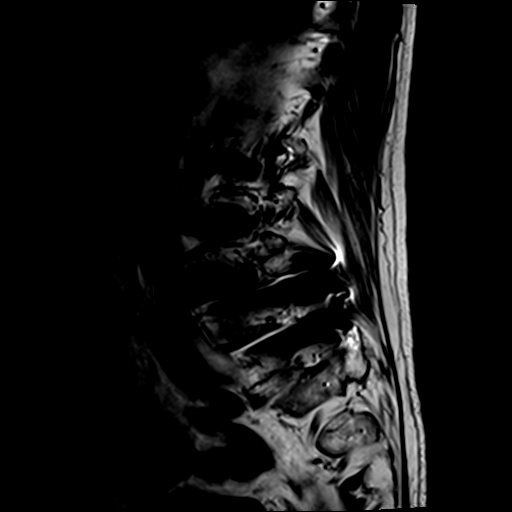
[im 4/14]
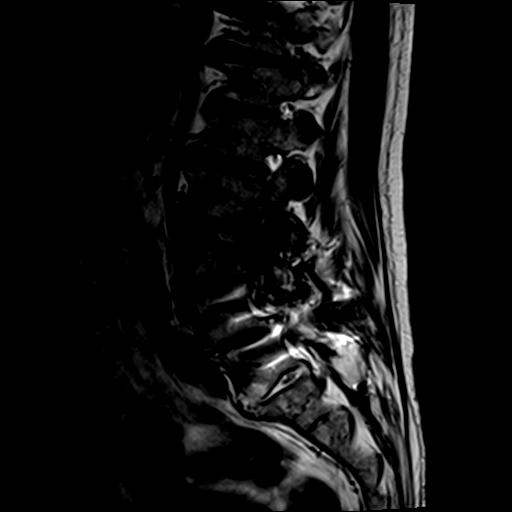
[im 7/14]
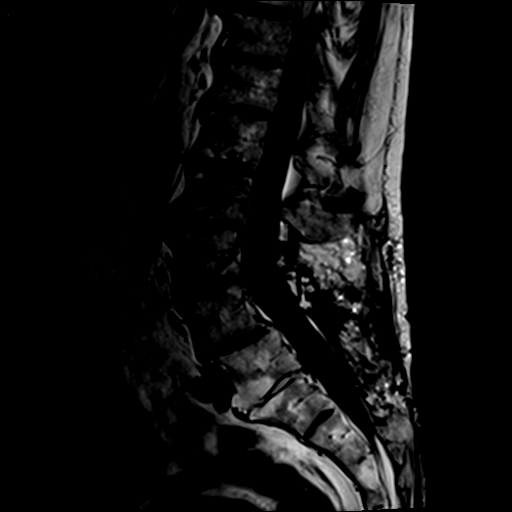
[im 10/14]
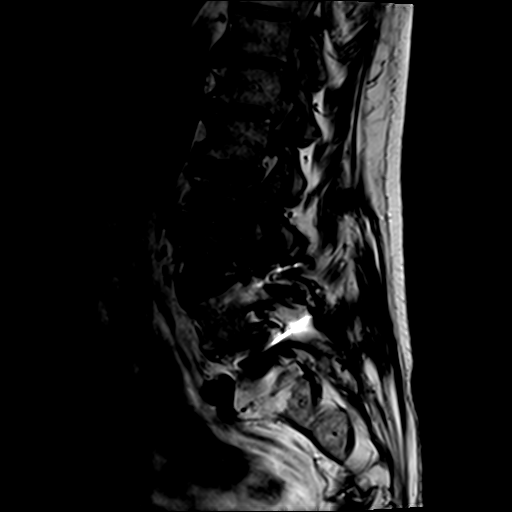
[im 14/14]
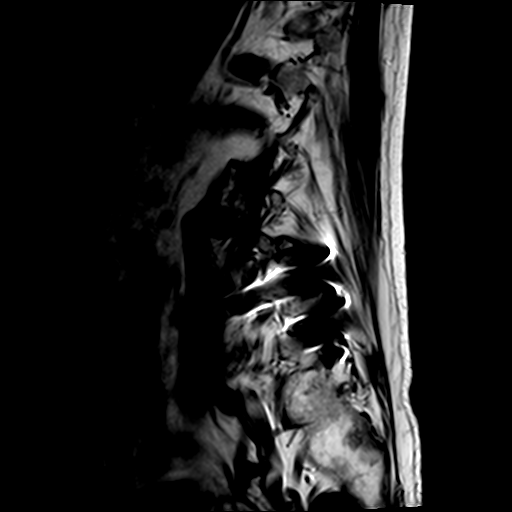

[Series 4: T2 · axial · 4.0mm · 0.78mm/px · z∈[-45,+138]mm · 10 of 39 slices shown (2 of 2)]
[im 3/39]
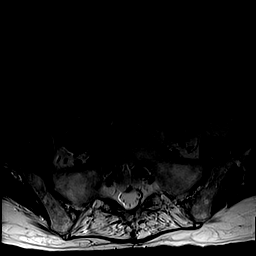
[im 6/39]
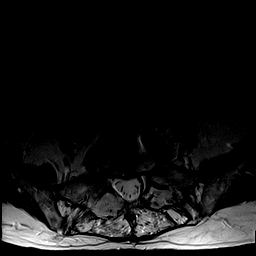
[im 8/39]
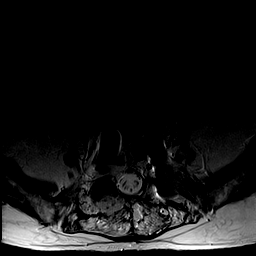
[im 13/39]
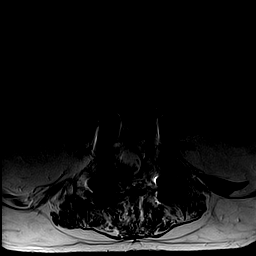
[im 18/39]
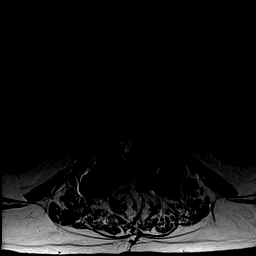
[im 21/39]
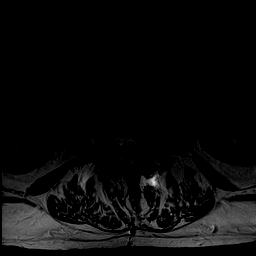
[im 23/39]
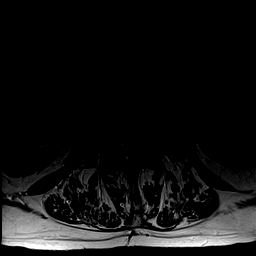
[im 28/39]
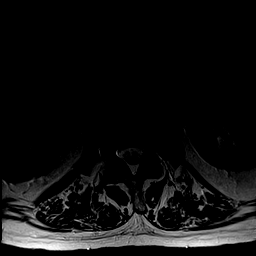
[im 33/39]
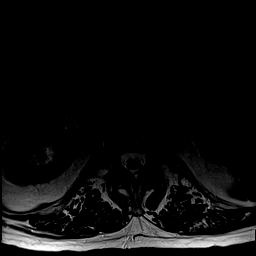
[im 39/39]
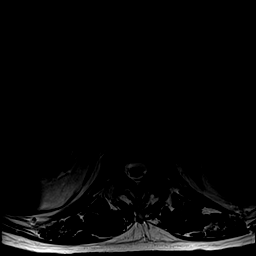

[Series 6: T1 · axial · 4.0mm · 0.74mm/px · z∈[-44,+138]mm · 10 of 39 slices shown (2 of 2)]
[im 3/39]
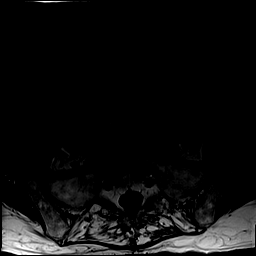
[im 6/39]
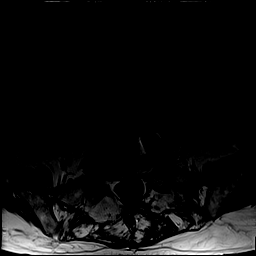
[im 8/39]
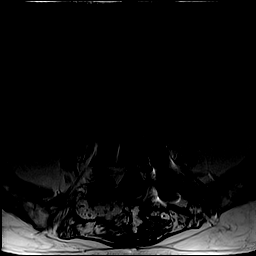
[im 13/39]
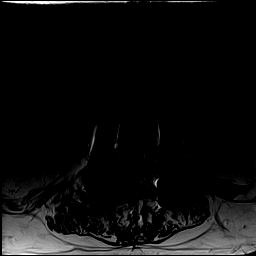
[im 18/39]
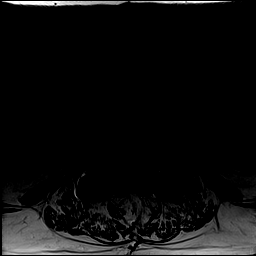
[im 21/39]
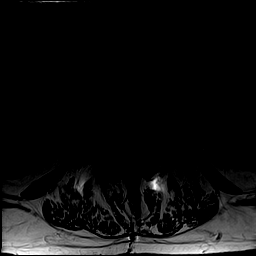
[im 23/39]
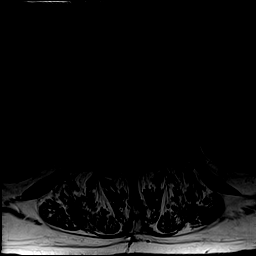
[im 28/39]
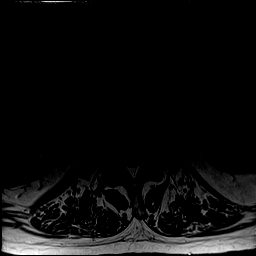
[im 33/39]
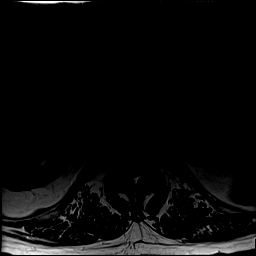
[im 39/39]
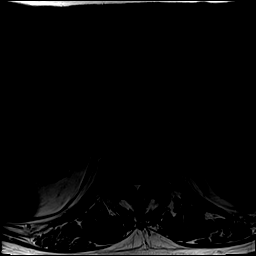

[30 of 48 positions shown; findings below may reference images not displayed]

FINDINGS: Vertebral body height is maintained. The patient is status post L4-5
fusion with unchanged 0.6 cm anterolisthesis L4 on L5. Trace
degenerative retrolisthesis L2 on L3 is noted. Scattered
degenerative endplate signal change is seen. There is no worrisome
marrow lesion. The conus medullaris is normal in signal and
position. Imaged intra-abdominal contents are unremarkable.

The T10-11 and T11-12 levels are imaged in the sagittal plane only.
There is a shallow broad-based disc bulge at T11-12 without central
canal narrowing. The T10-11 level is negative.

T12-L1:  Negative.

L1-2: Shallow disc bulge and mild facet arthropathy without central
canal or foraminal stenosis.

L2-3: There is a disc bulge, endplate spur and ligamentum flavum
thickening. Moderately severe to severe central canal stenosis is
present. There is also severe bilateral foraminal narrowing, worse
on the left.

L3-4: The patient is status post posterior decompression. There is a
disc bulge and endplate spur. The central canal is decompressed.
Moderately severe to severe foraminal narrowing appears worse on the
right.

L4-5: Status post laminectomy and fusion. The central canal and
foramina are open.

L5-S1: Status post laminectomy. The central canal and foramina are
widely patent.
IMPRESSION: Spondylosis most notable at L2-3 where there is moderately severe to
severe central canal stenosis and marked bilateral foraminal
narrowing.

Moderately severe to severe bilateral foraminal narrowing L3-4,
worse on the right. The patient is status post decompression at this
level and the central canal is open.

Status post L4-5 fusion.  The central canal and foramina are open.

Status post posterior decompression L5-S1. The central canal and
foramina are widely patent.

## 2017-06-30 DIAGNOSIS — J309 Allergic rhinitis, unspecified: Secondary | ICD-10-CM | POA: Diagnosis not present

## 2017-06-30 DIAGNOSIS — E559 Vitamin D deficiency, unspecified: Secondary | ICD-10-CM | POA: Diagnosis not present

## 2017-06-30 DIAGNOSIS — Z79899 Other long term (current) drug therapy: Secondary | ICD-10-CM | POA: Diagnosis not present

## 2017-06-30 DIAGNOSIS — I10 Essential (primary) hypertension: Secondary | ICD-10-CM | POA: Diagnosis not present

## 2017-06-30 DIAGNOSIS — E785 Hyperlipidemia, unspecified: Secondary | ICD-10-CM | POA: Diagnosis not present

## 2017-07-18 DIAGNOSIS — Z125 Encounter for screening for malignant neoplasm of prostate: Secondary | ICD-10-CM | POA: Diagnosis not present

## 2017-07-18 DIAGNOSIS — Z0000 Encounter for general adult medical examination without abnormal findings: Secondary | ICD-10-CM | POA: Diagnosis not present

## 2017-07-18 DIAGNOSIS — Z9181 History of falling: Secondary | ICD-10-CM | POA: Diagnosis not present

## 2017-07-18 DIAGNOSIS — E785 Hyperlipidemia, unspecified: Secondary | ICD-10-CM | POA: Diagnosis not present

## 2017-07-18 DIAGNOSIS — Z1331 Encounter for screening for depression: Secondary | ICD-10-CM | POA: Diagnosis not present

## 2017-07-18 DIAGNOSIS — Z1389 Encounter for screening for other disorder: Secondary | ICD-10-CM | POA: Diagnosis not present

## 2017-08-06 DIAGNOSIS — L821 Other seborrheic keratosis: Secondary | ICD-10-CM | POA: Diagnosis not present

## 2017-08-06 DIAGNOSIS — L570 Actinic keratosis: Secondary | ICD-10-CM | POA: Diagnosis not present

## 2017-08-06 DIAGNOSIS — L578 Other skin changes due to chronic exposure to nonionizing radiation: Secondary | ICD-10-CM | POA: Diagnosis not present

## 2017-09-17 DIAGNOSIS — M2170 Unequal limb length (acquired), unspecified site: Secondary | ICD-10-CM | POA: Diagnosis not present

## 2017-10-28 DIAGNOSIS — Z139 Encounter for screening, unspecified: Secondary | ICD-10-CM | POA: Diagnosis not present

## 2017-10-28 DIAGNOSIS — J309 Allergic rhinitis, unspecified: Secondary | ICD-10-CM | POA: Diagnosis not present

## 2017-10-28 DIAGNOSIS — I2510 Atherosclerotic heart disease of native coronary artery without angina pectoris: Secondary | ICD-10-CM | POA: Diagnosis not present

## 2017-11-07 ENCOUNTER — Other Ambulatory Visit: Payer: Self-pay | Admitting: Cardiology

## 2017-11-17 DIAGNOSIS — H353134 Nonexudative age-related macular degeneration, bilateral, advanced atrophic with subfoveal involvement: Secondary | ICD-10-CM | POA: Diagnosis not present

## 2017-11-17 DIAGNOSIS — H2513 Age-related nuclear cataract, bilateral: Secondary | ICD-10-CM | POA: Diagnosis not present

## 2018-02-03 DIAGNOSIS — Z79899 Other long term (current) drug therapy: Secondary | ICD-10-CM | POA: Diagnosis not present

## 2018-02-03 DIAGNOSIS — R739 Hyperglycemia, unspecified: Secondary | ICD-10-CM | POA: Diagnosis not present

## 2018-02-03 DIAGNOSIS — E559 Vitamin D deficiency, unspecified: Secondary | ICD-10-CM | POA: Diagnosis not present

## 2018-02-03 DIAGNOSIS — Z23 Encounter for immunization: Secondary | ICD-10-CM | POA: Diagnosis not present

## 2018-02-03 DIAGNOSIS — E785 Hyperlipidemia, unspecified: Secondary | ICD-10-CM | POA: Diagnosis not present

## 2018-04-28 ENCOUNTER — Other Ambulatory Visit: Payer: Self-pay | Admitting: Cardiology

## 2018-05-21 DIAGNOSIS — H353134 Nonexudative age-related macular degeneration, bilateral, advanced atrophic with subfoveal involvement: Secondary | ICD-10-CM | POA: Diagnosis not present

## 2018-05-21 DIAGNOSIS — H2513 Age-related nuclear cataract, bilateral: Secondary | ICD-10-CM | POA: Diagnosis not present

## 2018-05-22 ENCOUNTER — Ambulatory Visit: Payer: Medicare Other | Admitting: Cardiology

## 2018-05-22 ENCOUNTER — Encounter (INDEPENDENT_AMBULATORY_CARE_PROVIDER_SITE_OTHER): Payer: Self-pay

## 2018-05-22 ENCOUNTER — Encounter: Payer: Self-pay | Admitting: Cardiology

## 2018-05-22 VITALS — BP 132/70 | HR 57 | Ht 68.0 in | Wt 167.6 lb

## 2018-05-22 DIAGNOSIS — I10 Essential (primary) hypertension: Secondary | ICD-10-CM | POA: Diagnosis not present

## 2018-05-22 DIAGNOSIS — E782 Mixed hyperlipidemia: Secondary | ICD-10-CM

## 2018-05-22 DIAGNOSIS — I25709 Atherosclerosis of coronary artery bypass graft(s), unspecified, with unspecified angina pectoris: Secondary | ICD-10-CM

## 2018-05-22 NOTE — Progress Notes (Signed)
Patient ID: John [name], male   DOB: 11/22/1934, 84 y.o.   MRN: [id number]    Patient Name: John [name] Date of Encounter: 05/22/2018  Primary Care Provider:  Nicholos Johns, MD Primary Cardiologist:  Ena Dawley  Patient Profile  CAD  Problem List   Past Medical History:  Diagnosis Date  . CAD (coronary artery disease)   . MI (myocardial infarction) ([place]) 1978  . Spinal stenosis    L4-5  . Spondylolisthesis    L4-5   Past Surgical History:  Procedure Laterality Date  . CORONARY ARTERY BYPASS GRAFT  1999   3 VESSEL  . LUMBAR LAMINECTOMY     with partial facetectomy (L2-3, L3-4, L4-5, L5-S1)   Allergies  No Known Allergies  HPI  83 year old male with h/o CAD, s/o CABG in 1998. He reports that he felt exertional right shoulder pain and chest tightness prior to the surgery. Tat resolved and he has been asymptomatic since then. He has been followed by a PCP. He is very active, walking several miles a day, working in a back yard without any limitations. He denies palpitations, syncope. He is compliant with his meds.  02/18/2014 - 1 year follow up, the patient is experiencing left sided chest pain, left arm and elbow pain that woke him up from sleep. He took 4 aspirins that relieved the pain, he still has some residual back and arm pain.  02/29/2016 - 1 year follow up, The patient states that he has been doing really well in the last year he underwent back surgery and hip replacement and he is currently pain-free and able to do all activities of daily living and thinks he enjoyed. He denies any chest pain or shortness of breath. No dizziness or palpitations or syncope. No muscle pain from pravastatin.   In 2015 he underwent stress test, exercise nuclear stress test that showed no ischemia and hypertensive response to stress that improved with initiation of amlodipine.   05/16/2017 - the patient is coming after one year, he and his wife have a very busy life, he is very  active around his house and denies any chest pain shortness of breath no palpitations claudications dizziness or syncope. In the last year he underwent hip replacement twice as the first one was dysfunctional. He had no complications during the surgery. The couple is very happy today appear celebrated their 32th wedding anniversary. He has been compliant with his medication and has no side effects.  05/22/2018, patient is coming after year he is feeling and looking great, denies any symptoms whatsoever, he is compliant with his meds has no side effects and remains very active.  Home Medications  Prior to Admission medications   Medication Sig Start Date End Date Taking? Authorizing Provider  aspirin 81 MG tablet Take 81 mg by mouth daily.   Yes Historical Provider, MD  beta carotene w/minerals (OCUVITE) tablet Take 2 tablets by mouth daily.   Yes Historical Provider, MD  fish oil-omega-3 fatty acids 1000 MG capsule Take 2 g by mouth daily.   Yes Historical Provider, MD  metoprolol succinate (TOPROL-XL) 25 MG 24 hr tablet  01/26/13  Yes Historical Provider, MD  naproxen (NAPROSYN) 375 MG tablet  01/11/13  Yes Historical Provider, MD  pravastatin (PRAVACHOL) 80 MG tablet  02/04/13  Yes Historical Provider, MD    Family History  No family history on file.  Social History  Social History   Socioeconomic History  . Marital status: Single  Spouse name: Not on file  . Number of children: Not on file  . Years of education: Not on file  . Highest education level: Not on file  Occupational History  . Not on file  Social Needs  . Financial resource strain: Not on file  . Food insecurity:    Worry: Not on file    Inability: Not on file  . Transportation needs:    Medical: Not on file    Non-medical: Not on file  Tobacco Use  . Smoking status: Former Smoker    Types: Cigarettes    Last attempt to quit: 11/19/1976    Years since quitting: 41.5  . Smokeless tobacco: Never Used  Substance  and Sexual Activity  . Alcohol use: No  . Drug use: No  . Sexual activity: Not on file  Lifestyle  . Physical activity:    Days per week: Not on file    Minutes per session: Not on file  . Stress: Not on file  Relationships  . Social connections:    Talks on phone: Not on file    Gets together: Not on file    Attends religious service: Not on file    Active member of club or organization: Not on file    Attends meetings of clubs or organizations: Not on file    Relationship status: Not on file  . Intimate partner violence:    Fear of current or ex partner: Not on file    Emotionally abused: Not on file    Physically abused: Not on file    Forced sexual activity: Not on file  Other Topics Concern  . Not on file  Social History Narrative  . Not on file     Review of Systems General:  No chills, fever, night sweats or weight changes.  Cardiovascular:  No chest pain, dyspnea on exertion, edema, orthopnea, palpitations, paroxysmal nocturnal dyspnea. Dermatological: No rash, lesions/masses Respiratory: No cough, dyspnea Urologic: No hematuria, dysuria Abdominal:   No nausea, vomiting, diarrhea, bright red blood per rectum, melena, or hematemesis Neurologic:  No visual changes, wkns, changes in mental status. All other systems reviewed and are otherwise negative except as noted above.  Physical Exam  Blood pressure 132/70, pulse (!) 57, height 5\' 8"  (1.727 m), weight 167 lb 9.6 oz (76 kg), SpO2 97 %.  General: Pleasant, NAD Psych: Normal affect. Neuro: Alert and oriented X 3. Moves all extremities spontaneously. HEENT: Normal  Neck: Supple without bruits or JVD. Lungs:  Resp regular and unlabored, CTA. Heart: RRR no s3, s4, or murmurs. Abdomen: Soft, non-tender, non-distended, BS + x 4.  Extremities: No clubbing, cyanosis or edema. DP/PT/Radials 2+ and equal bilaterally.  Accessory Clinical Findings  ECG - performed today 05/20/2017 shows normal sinus bradycardia, normal  EKG unchanged from prior. This was personally reviewed.  ECHO: 02/15/13 Left ventricle: The cavity size was normal. Wall thickness was increased in a pattern of mild LVH. Systolic function was normal. The estimated ejection fraction was in the range of 55% to 60%. Wall motion was normal; there were no regional wall motion abnormalities. Features are consistent with a pseudonormal left ventricular filling pattern, with concomitant abnormal relaxation and increased filling pressure (grade 2 diastolic dysfunction).  Nuclear stress test 01/2014 Impression Exercise Capacity:  Fair exercise capacity. BP Response:  Hypertensive blood pressure response. Clinical Symptoms:  No significant symptoms noted. ECG Impression:  No significant ST segment change suggestive of ischemia. Comparison with Prior Nuclear Study: No images to  compare  Overall Impression:  Normal stress nuclear study. There is no scar or ischemia. Low risk scan. There is a hypertensive response to stress. LV Ejection Fraction: 57%.  LV Wall Motion:  Normal Wall Motion.   Assessment & Plan  1. CAD status post CABG in 1999 - the patient is asymptomatic, very active, we will continue the same management, there is no indication for ischemic for the evaluation at this point, his EKG is normal and unchanged from prior.  2. Hypertension - controlled.   3. Hyperlipidemia - followed by his PCP, based on patient they were all WNL. Continue pravachol 80 mg QHS. History tolerating it well.   Follow up in 1 year.  Ena Dawley, MD 05/22/2018, 10:29 AM

## 2018-05-22 NOTE — Patient Instructions (Signed)
Medication Instructions:   Your physician recommends that you continue on your current medications as directed. Please refer to the Current Medication list given to you today.  If you need a refill on your cardiac medications before your next appointment, please call your pharmacy.      Follow-Up: At [facility], you and your health needs are our priority.  As part of our continuing mission to provide you with exceptional heart care, we have created designated Provider Care Teams.  These Care Teams include your primary Cardiologist (physician) and Advanced Practice Providers (APPs -  Physician Assistants and Nurse Practitioners) who all work together to provide you with the care you need, when you need it. You will need a follow up appointment in 12 months.  Please call our office 2 months in advance to schedule this appointment.  You may see Ena Dawley, MD or one of the following Advanced Practice Providers on your designated Care Team:   [place], PA-C Melina Copa, PA-C . Ermalinda Barrios, PA-C

## 2018-06-01 DIAGNOSIS — R6889 Other general symptoms and signs: Secondary | ICD-10-CM | POA: Diagnosis not present

## 2018-06-08 DIAGNOSIS — I2510 Atherosclerotic heart disease of native coronary artery without angina pectoris: Secondary | ICD-10-CM | POA: Diagnosis not present

## 2018-06-08 DIAGNOSIS — R739 Hyperglycemia, unspecified: Secondary | ICD-10-CM | POA: Diagnosis not present

## 2018-06-08 DIAGNOSIS — Z139 Encounter for screening, unspecified: Secondary | ICD-10-CM | POA: Diagnosis not present

## 2018-06-08 DIAGNOSIS — H6123 Impacted cerumen, bilateral: Secondary | ICD-10-CM | POA: Diagnosis not present

## 2018-07-24 DIAGNOSIS — E785 Hyperlipidemia, unspecified: Secondary | ICD-10-CM | POA: Diagnosis not present

## 2018-07-24 DIAGNOSIS — Z9181 History of falling: Secondary | ICD-10-CM | POA: Diagnosis not present

## 2018-07-24 DIAGNOSIS — Z0000 Encounter for general adult medical examination without abnormal findings: Secondary | ICD-10-CM | POA: Diagnosis not present

## 2018-09-28 DIAGNOSIS — S50812A Abrasion of left forearm, initial encounter: Secondary | ICD-10-CM | POA: Diagnosis not present

## 2018-10-08 DIAGNOSIS — E785 Hyperlipidemia, unspecified: Secondary | ICD-10-CM | POA: Diagnosis not present

## 2018-10-08 DIAGNOSIS — S41112D Laceration without foreign body of left upper arm, subsequent encounter: Secondary | ICD-10-CM | POA: Diagnosis not present

## 2018-10-08 DIAGNOSIS — I10 Essential (primary) hypertension: Secondary | ICD-10-CM | POA: Diagnosis not present

## 2018-10-08 DIAGNOSIS — E559 Vitamin D deficiency, unspecified: Secondary | ICD-10-CM | POA: Diagnosis not present

## 2019-01-06 DIAGNOSIS — I10 Essential (primary) hypertension: Secondary | ICD-10-CM | POA: Diagnosis not present

## 2019-01-06 DIAGNOSIS — E559 Vitamin D deficiency, unspecified: Secondary | ICD-10-CM | POA: Diagnosis not present

## 2019-01-06 DIAGNOSIS — E785 Hyperlipidemia, unspecified: Secondary | ICD-10-CM | POA: Diagnosis not present

## 2019-01-06 DIAGNOSIS — I2510 Atherosclerotic heart disease of native coronary artery without angina pectoris: Secondary | ICD-10-CM | POA: Diagnosis not present

## 2019-01-07 DIAGNOSIS — E559 Vitamin D deficiency, unspecified: Secondary | ICD-10-CM | POA: Diagnosis not present

## 2019-01-07 DIAGNOSIS — I10 Essential (primary) hypertension: Secondary | ICD-10-CM | POA: Diagnosis not present

## 2019-01-07 DIAGNOSIS — E785 Hyperlipidemia, unspecified: Secondary | ICD-10-CM | POA: Diagnosis not present

## 2019-01-07 DIAGNOSIS — Z79899 Other long term (current) drug therapy: Secondary | ICD-10-CM | POA: Diagnosis not present

## 2019-02-02 DIAGNOSIS — L570 Actinic keratosis: Secondary | ICD-10-CM | POA: Diagnosis not present

## 2019-02-20 DIAGNOSIS — Z23 Encounter for immunization: Secondary | ICD-10-CM | POA: Diagnosis not present

## 2019-03-31 DIAGNOSIS — Z8679 Personal history of other diseases of the circulatory system: Secondary | ICD-10-CM | POA: Diagnosis not present

## 2019-03-31 DIAGNOSIS — M1611 Unilateral primary osteoarthritis, right hip: Secondary | ICD-10-CM | POA: Diagnosis not present

## 2019-03-31 DIAGNOSIS — E559 Vitamin D deficiency, unspecified: Secondary | ICD-10-CM | POA: Diagnosis not present

## 2019-03-31 DIAGNOSIS — J309 Allergic rhinitis, unspecified: Secondary | ICD-10-CM | POA: Diagnosis not present

## 2019-06-09 ENCOUNTER — Encounter: Payer: Self-pay | Admitting: Cardiology

## 2019-06-28 ENCOUNTER — Telehealth: Payer: Self-pay | Admitting: Cardiology

## 2019-06-28 NOTE — Telephone Encounter (Signed)
*  STAT* If patient is at the pharmacy, call can be transferred to refill team.   1. Which medications need to be refilled patient? (please list name of each medication and dose if known) Pravastatin 40 mg, amlodipine 5 mg, metoprolol 25 mg  2. Which pharmacy/location (including street and city if local pharmacy) is medication to be sent to? [phone]  3. Do they need a 30 day or 90 day supply? Would like a 90 days. Patient need these and do not have a apt with her until 08/27/19. Patient need medications before the apt. Former doctor will not refill because the patient has transfer to this office.

## 2019-06-29 DIAGNOSIS — I10 Essential (primary) hypertension: Secondary | ICD-10-CM | POA: Diagnosis not present

## 2019-06-29 DIAGNOSIS — I2510 Atherosclerotic heart disease of native coronary artery without angina pectoris: Secondary | ICD-10-CM | POA: Diagnosis not present

## 2019-06-29 DIAGNOSIS — E785 Hyperlipidemia, unspecified: Secondary | ICD-10-CM | POA: Diagnosis not present

## 2019-06-29 MED ORDER — PRAVASTATIN SODIUM 80 MG PO TABS: 40.0000 mg | ORAL_TABLET | Freq: Every day | ORAL | 0 refills | Status: DC

## 2019-06-29 MED ORDER — METOPROLOL SUCCINATE ER 25 MG PO TB24: 25.0000 mg | ORAL_TABLET | Freq: Every day | ORAL | 0 refills | Status: DC

## 2019-06-29 MED ORDER — AMLODIPINE BESYLATE 5 MG PO TABS: 5.0000 mg | ORAL_TABLET | Freq: Every day | ORAL | 0 refills | Status: DC

## 2019-06-29 NOTE — Telephone Encounter (Signed)
Pt's medication was sent to pt's pharmacy as requested. Confirmation received.  °

## 2019-07-07 DIAGNOSIS — L820 Inflamed seborrheic keratosis: Secondary | ICD-10-CM | POA: Diagnosis not present

## 2019-07-30 ENCOUNTER — Ambulatory Visit: Payer: Medicare Other | Admitting: Cardiology

## 2019-08-27 ENCOUNTER — Encounter: Payer: Self-pay | Admitting: Cardiology

## 2019-08-27 ENCOUNTER — Other Ambulatory Visit: Payer: Self-pay

## 2019-08-27 ENCOUNTER — Ambulatory Visit: Payer: Medicare Other | Admitting: Cardiology

## 2019-08-27 VITALS — BP 136/80 | HR 63 | Ht 68.0 in | Wt 160.2 lb

## 2019-08-27 DIAGNOSIS — I10 Essential (primary) hypertension: Secondary | ICD-10-CM | POA: Diagnosis not present

## 2019-08-27 DIAGNOSIS — I25709 Atherosclerosis of coronary artery bypass graft(s), unspecified, with unspecified angina pectoris: Secondary | ICD-10-CM

## 2019-08-27 MED ORDER — NITROGLYCERIN 0.4 MG SL SUBL: SUBLINGUAL_TABLET | SUBLINGUAL | 4 refills | Status: DC

## 2019-08-27 NOTE — Progress Notes (Signed)
Patient ID: John [name], male   DOB: [birth year], 85 y.o.   MRN: [id number]    Patient Name: John [name] Date of Encounter: 08/27/2019  Primary Care Provider:  Velna Hatchet, MD Primary Cardiologist:  Ena Dawley  Patient Profile  Reason for visit: 1 year follow-up  Problem List   Past Medical History:  Diagnosis Date  . CAD (coronary artery disease)   . MI (myocardial infarction) ([place]) 1978  . Spinal stenosis    L4-5  . Spondylolisthesis    L4-5   Past Surgical History:  Procedure Laterality Date  . CORONARY ARTERY BYPASS GRAFT  1999   3 VESSEL  . LUMBAR LAMINECTOMY     with partial facetectomy (L2-3, L3-4, L4-5, L5-S1)   Allergies  No Known Allergies  HPI  85 year old male with h/o CAD, s/o CABG in 1998. He reports that he felt exertional right shoulder pain and chest tightness prior to the surgery. Tat resolved and he has been asymptomatic since then. He has been followed by a PCP. He is very active, walking several miles a day, working in a back yard without any limitations. He denies palpitations, syncope. He is compliant with his meds.  02/18/2014 - 1 year follow up, the patient is experiencing left sided chest pain, left arm and elbow pain that woke him up from sleep. He took 4 aspirins that relieved the pain, he still has some residual back and arm pain.  02/29/2016 - 1 year follow up, The patient states that he has been doing really well in the last year he underwent back surgery and hip replacement and he is currently pain-free and able to do all activities of daily living and thinks he enjoyed. He denies any chest pain or shortness of breath. No dizziness or palpitations or syncope. No muscle pain from pravastatin.   In 2015 he underwent stress test, exercise nuclear stress test that showed no ischemia and hypertensive response to stress that improved with initiation of amlodipine.   08/27/2019 -the patient is coming after year, he has been doing  great, he remains very active, he was just caring 100 pound stacks of pine needles and has not noticed any chest pain or shortness of breath.  He has been compliant with his medication and has no side effects.  Denies any myalgias.  No lower extremity edema or claudications.  No palpitation dizziness or syncope.  Home Medications  Prior to Admission medications   Medication Sig Start Date End Date Taking? Authorizing Provider  aspirin 81 MG tablet Take 81 mg by mouth daily.   Yes Historical Provider, MD  beta carotene w/minerals (OCUVITE) tablet Take 2 tablets by mouth daily.   Yes Historical Provider, MD  fish oil-omega-3 fatty acids 1000 MG capsule Take 2 g by mouth daily.   Yes Historical Provider, MD  metoprolol succinate (TOPROL-XL) 25 MG 24 hr tablet  01/26/13  Yes Historical Provider, MD  naproxen (NAPROSYN) 375 MG tablet  01/11/13  Yes Historical Provider, MD  pravastatin (PRAVACHOL) 80 MG tablet  02/04/13  Yes Historical Provider, MD    Family History  No family history on file.  Social History  Social History   Socioeconomic History  . Marital status: Single    Spouse name: Not on file  . Number of children: Not on file  . Years of education: Not on file  . Highest education level: Not on file  Occupational History  . Not on file  Tobacco Use  .  Smoking status: Former Smoker    Types: Cigarettes    Quit date: 11/19/1976    Years since quitting: 42.7  . Smokeless tobacco: Never Used  Substance and Sexual Activity  . Alcohol use: No  . Drug use: No  . Sexual activity: Not on file  Other Topics Concern  . Not on file  Social History Narrative  . Not on file   Social Determinants of Health   Financial Resource Strain:   . Difficulty of Paying Living Expenses:   Food Insecurity:   . Worried About Charity fundraiser in the Last Year:   . Arboriculturist in the Last Year:   Transportation Needs:   . Film/video editor (Medical):   Marland Kitchen Lack of Transportation  (Non-Medical):   Physical Activity:   . Days of Exercise per Week:   . Minutes of Exercise per Session:   Stress:   . Feeling of Stress :   Social Connections:   . Frequency of Communication with Friends and Family:   . Frequency of Social Gatherings with Friends and Family:   . Attends Religious Services:   . Active Member of Clubs or Organizations:   . Attends Archivist Meetings:   Marland Kitchen Marital Status:   Intimate Partner Violence:   . Fear of Current or Ex-Partner:   . Emotionally Abused:   Marland Kitchen Physically Abused:   . Sexually Abused:      Review of Systems General:  No chills, fever, night sweats or weight changes.  Cardiovascular:  No chest pain, dyspnea on exertion, edema, orthopnea, palpitations, paroxysmal nocturnal dyspnea. Dermatological: No rash, lesions/masses Respiratory: No cough, dyspnea Urologic: No hematuria, dysuria Abdominal:   No nausea, vomiting, diarrhea, bright red blood per rectum, melena, or hematemesis Neurologic:  No visual changes, wkns, changes in mental status. All other systems reviewed and are otherwise negative except as noted above.  Physical Exam  Blood pressure 136/80, pulse 63, height 5\' 8"  (1.727 m), weight 160 lb 3.2 oz (72.7 kg), SpO2 96 %.  General: Pleasant, NAD Psych: Normal affect. Neuro: Alert and oriented X 3. Moves all extremities spontaneously. HEENT: Normal  Neck: Supple without bruits or JVD. Lungs:  Resp regular and unlabored, CTA. Heart: RRR no s3, s4, or murmurs. Abdomen: Soft, non-tender, non-distended, BS + x 4.  Extremities: No clubbing, cyanosis or edema. DP/PT/Radials 2+ and equal bilaterally.  Accessory Clinical Findings  ECG - performed today 05/20/2017 shows normal sinus bradycardia, normal EKG unchanged from prior. This was personally reviewed.  ECHO: 02/15/13 Left ventricle: The cavity size was normal. Wall thickness was increased in a pattern of mild LVH. Systolic function was normal. The estimated  ejection fraction was in the range of 55% to 60%. Wall motion was normal; there were no regional wall motion abnormalities. Features are consistent with a pseudonormal left ventricular filling pattern, with concomitant abnormal relaxation and increased filling pressure (grade 2 diastolic dysfunction).  Nuclear stress test 01/2014 Impression Exercise Capacity:  Fair exercise capacity. BP Response:  Hypertensive blood pressure response. Clinical Symptoms:  No significant symptoms noted. ECG Impression:  No significant ST segment change suggestive of ischemia. Comparison with Prior Nuclear Study: No images to compare  Overall Impression:  Normal stress nuclear study. There is no scar or ischemia. Low risk scan. There is a hypertensive response to stress. LV Ejection Fraction: 57%.  LV Wall Motion:  Normal Wall Motion.   Assessment & Plan  1. CAD status post CABG in 1999 -  the patient is asymptomatic, very active, we will continue the same management, there is no indication for ischemic for the evaluation at this point, his EKG is normal and unchanged from prior.  We will continue aspirin, amlodipine, metoprolol, pravastatin.  We will refill sublingual nitroglycerin.  2. Hypertension - controlled.   3. Hyperlipidemia -Continue pravachol 80 mg QHS. History tolerating it well.  His lipids are all at goal.   Follow up in 1 year.  Ena Dawley, MD 08/27/2019, 4:06 PM

## 2019-08-27 NOTE — Patient Instructions (Signed)
Medication Instructions:  NO CHANGES *If you need a refill on your cardiac medications before your next appointment, please call your pharmacy*   Lab Work: NONE If you have labs (blood work) drawn today and your tests are completely normal, you will receive your results only by: Marland Kitchen MyChart Message (if you have MyChart) OR . A paper copy in the mail If you have any lab test that is abnormal or we need to change your treatment, we will call you to review the results.   Testing/Procedures: NONE   Follow-Up: At [facility], you and your health needs are our priority.  As part of our continuing mission to provide you with exceptional heart care, we have created designated Provider Care Teams.  These Care Teams include your primary Cardiologist (physician) and Advanced Practice Providers (APPs -  Physician Assistants and Nurse Practitioners) who all work together to provide you with the care you need, when you need it.  We recommend signing up for the patient portal called "MyChart".  Sign up information is provided on this After Visit Summary.  MyChart is used to connect with patients for Virtual Visits (Telemedicine).  Patients are able to view lab/test results, encounter notes, upcoming appointments, etc.  Non-urgent messages can be sent to your provider as well.   To learn more about what you can do with MyChart, go to [email or web address].    Your next appointment:   1 year(s)  The format for your next appointment:   In Person  Provider:   Ena Dawley, MD   Other Instructions

## 2019-09-18 ENCOUNTER — Other Ambulatory Visit: Payer: Self-pay | Admitting: Cardiology

## 2019-12-23 ENCOUNTER — Other Ambulatory Visit: Payer: Self-pay | Admitting: Cardiology

## 2020-03-22 ENCOUNTER — Other Ambulatory Visit: Payer: Self-pay | Admitting: Cardiology

## 2020-11-06 ENCOUNTER — Other Ambulatory Visit: Payer: Self-pay

## 2020-11-06 NOTE — Telephone Encounter (Signed)
Pt needs to schedule a follow-up visit with Dr. Johney Frame or whomever he chooses to establish with, with our Practice.   He must schedule a follow-up appointment with Dr. Johney Frame and can have refills up until that point.  Thanks!

## 2020-11-06 NOTE — Telephone Encounter (Signed)
Called and left message for patient to call back. He needs appointment scheduled with Dr Johney Frame or a provider in practice as Dr Meda Coffee is no longer here. So he can get refill of Amlodipine 5 mg daily.

## 2020-11-07 ENCOUNTER — Telehealth: Payer: Self-pay | Admitting: Family

## 2020-11-07 MED ORDER — AMLODIPINE BESYLATE 5 MG PO TABS: 5.0000 mg | ORAL_TABLET | Freq: Every day | ORAL | 0 refills | Status: DC

## 2020-11-07 NOTE — Telephone Encounter (Signed)
Pt is requesting a refill on amlodipine. Pt has an upcoming appt on 11/10/20 with Dell Ponto, NP, former Dr. Meda Coffee pt. Would Dell Ponto, NP like to refill until appt time? Please address

## 2020-11-07 NOTE — Telephone Encounter (Signed)
Refill has already been sent in. 

## 2020-11-07 NOTE — Telephone Encounter (Signed)
Noted the pt now has an appt scheduled with our office to see Laurann Montana NP for 11/10/20. Gave him #15 tablets with 0 refills of amlodipine 5 mg po daily and sent to his pharmacy with a note stating the pt must keep his appt on 7/1 for further refills after that.

## 2020-11-07 NOTE — Addendum Note (Signed)
Addended by: Nuala Alpha on: 11/07/2020 03:18 PM   Modules accepted: Orders

## 2020-11-07 NOTE — Telephone Encounter (Signed)
*  STAT* If patient is at the pharmacy, call can be transferred to refill team.   1. Which medications need to be refilled? (please list name of each medication and dose if known) amLODipine (NORVASC) 5 MG tablet  2. Which pharmacy/location (including street and city if local pharmacy) is medication to be sent to? [phone] [place] [place] [facility] [address]  3. Do they need a 30 day or 90 day supply? 90 day   Patient is out of medication. He is scheduled 11/10/2020

## 2020-11-07 NOTE — Telephone Encounter (Signed)
Refills, pt can have 15 days worth of refill for requested med, to allow him to call back to arrange appt with Dr. Johney Frame.  Further refills will be advised on based on when he calls back to schedule his overdue follow-up appt.

## 2020-11-10 ENCOUNTER — Encounter: Payer: Self-pay | Admitting: Family

## 2020-11-10 ENCOUNTER — Ambulatory Visit: Payer: Medicare Other | Admitting: Family

## 2020-11-10 ENCOUNTER — Other Ambulatory Visit: Payer: Self-pay

## 2020-11-10 VITALS — BP 150/80 | HR 58 | Ht 67.0 in | Wt 151.4 lb

## 2020-11-10 DIAGNOSIS — E785 Hyperlipidemia, unspecified: Secondary | ICD-10-CM

## 2020-11-10 DIAGNOSIS — I25709 Atherosclerosis of coronary artery bypass graft(s), unspecified, with unspecified angina pectoris: Secondary | ICD-10-CM

## 2020-11-10 DIAGNOSIS — I10 Essential (primary) hypertension: Secondary | ICD-10-CM | POA: Diagnosis not present

## 2020-11-10 MED ORDER — METOPROLOL SUCCINATE ER 25 MG PO TB24: 25.0000 mg | ORAL_TABLET | Freq: Every day | ORAL | 0 refills | Status: DC

## 2020-11-10 MED ORDER — PRAVASTATIN SODIUM 80 MG PO TABS: 40.0000 mg | ORAL_TABLET | Freq: Every day | ORAL | 3 refills | Status: DC

## 2020-11-10 MED ORDER — AMLODIPINE BESYLATE 5 MG PO TABS: 5.0000 mg | ORAL_TABLET | Freq: Every day | ORAL | 3 refills | Status: DC

## 2020-11-10 MED ORDER — NITROGLYCERIN 0.4 MG SL SUBL: SUBLINGUAL_TABLET | SUBLINGUAL | 4 refills | Status: DC

## 2020-11-10 NOTE — Patient Instructions (Signed)
Medication Instructions:  Continue your current medications.   *If you need a refill on your cardiac medications before your next appointment, please call your pharmacy*   Lab Work: None ordered today  Testing/Procedures: Your EKG today showed sinus bradycardia which is a slow but stable heart rhythm. So long as you feel well, we are okay if your resting heart rate is 50--99bpm.    Follow-Up: At [facility], you and your health needs are our priority.  As part of our continuing mission to provide you with exceptional heart care, we have created designated Provider Care Teams.  These Care Teams include your primary Cardiologist (physician) and Advanced Practice Providers (APPs -  Physician Assistants and Nurse Practitioners) who all work together to provide you with the care you need, when you need it.  We recommend signing up for the patient portal called "MyChart".  Sign up information is provided on this After Visit Summary.  MyChart is used to connect with patients for Virtual Visits (Telemedicine).  Patients are able to view lab/test results, encounter notes, upcoming appointments, etc.  Non-urgent messages can be sent to your provider as well.   To learn more about what you can do with MyChart, go to [email or web address].    Your next appointment:   1 year(s)  The format for your next appointment:   In Person  Provider:   You may see Freada Bergeron, MD or one of the following Advanced Practice Providers on your designated Care Team:   Richardson Dopp, PA-C [place], [place]   Other Instructions  Heart Healthy Diet Recommendations: A low-salt diet is recommended. Meats should be grilled, baked, or boiled. Avoid fried foods. Focus on lean protein sources like fish or chicken with vegetables and fruits. The American Heart Association is a Microbiologist!  American Heart Association Diet and Lifeystyle Recommendations   Exercise recommendations: The American Heart  Association recommends 150 minutes of moderate intensity exercise weekly. Try 30 minutes of moderate intensity exercise 4-5 times per week. This could include walking, jogging, or swimming.

## 2020-11-10 NOTE — Progress Notes (Signed)
Office Visit    Patient Name: John [name] Date of Encounter: 11/10/2020  PCP:  Velna Hatchet, MD   [place]  Cardiologist:  Freada Bergeron, MD  Advanced Practice Provider:  No care team member to display Electrophysiologist:  None   Chief Complaint    John [name] is a 86 y.o. male with a hx of CAD s/p CABG 1998, hyperlipidemia, hypertension presents today for followup of CAD   Past Medical History    Past Medical History:  Diagnosis Date   CAD (coronary artery disease)    MI (myocardial infarction) ([place]) 1978   Spinal stenosis    L4-5   Spondylolisthesis    L4-5   Past Surgical History:  Procedure Laterality Date   CORONARY ARTERY BYPASS GRAFT  1999   3 VESSEL   LUMBAR LAMINECTOMY     with partial facetectomy (L2-3, L3-4, L4-5, L5-S1)    Allergies  No Known Allergies  History of Present Illness    John [name] is a 86 y.o. male with a hx of CAD s/p CABG 1998, hypertension, hyperlipidemia last seen 08/27/2019 by Dr. Meda Coffee.  He has a history of coronary artery disease dating back to CABG 1998.  His anginal equivalent at that time was exertional right shoulder pain and chest tightness.  He has been asymptomatic since that time.  In 2015 underwent stress test, exercise nuclear stress test with no evidence of ischemia and hypertensive response to stress that improved with initiation of amlodipine.  He was last seen 08/27/2019 doing well from a cardiac perspective.  No changes were made and he was recommended follow-up in 1 year.  He presents today for follow-up. Tells me his blood pressure at home has been overall well controlled at goal of less than 130/80.  He is feeling somewhat stressed today as he misplaced his hearing aid immediately prior to the visit and he is not someone he usually misplaces things.  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 Reports no shortness of breath nor dyspnea on  exertion. Reports no chest pain, pressure, or tightness. No edema, orthopnea, PND. Reports no palpitations.  Endorses following a heart healthy diet.  EKGs/Labs/Other Studies Reviewed:   The following studies were reviewed today:  ECHO: 02/15/13 Left ventricle: The cavity size was normal. Wall thickness was increased in a pattern of mild LVH. Systolic function was normal. The estimated ejection fraction was in the range of 55% to 60%. Wall motion was normal; there were no regional wall motion abnormalities. Features are consistent with a pseudonormal left ventricular filling pattern, with concomitant abnormal relaxation and increased filling pressure (grade 2 diastolic dysfunction).  EKG:  EKG is ordered today.  The ekg ordered today demonstrates sinus bradycardia 58 bpm with no acute ST/T wave changes.   Recent Labs: No results found for requested labs within last 8760 hours.  Recent Lipid Panel No results found for: CHOL, TRIG, HDL, CHOLHDL, VLDL, LDLCALC, LDLDIRECT  Home Medications   Current Meds  Medication Sig   amoxicillin (AMOXIL) 500 MG capsule Take 500 mg by mouth 3 (three) times daily.   aspirin 81 MG tablet Take 81 mg by mouth daily.   beta carotene w/minerals (OCUVITE) tablet Take 2 tablets by mouth daily.   Fluticasone Furoate 50 MCG/ACT AEPB Place 2 sprays into the nose as needed.   tamsulosin (FLOMAX) 0.4 MG CAPS capsule Take 0.4 mg by mouth daily.   Vitamin D, Ergocalciferol, (  DRISDOL) 50000 units CAPS capsule Take 1 capsule by mouth once a week.   [DISCONTINUED] amLODipine (NORVASC) 5 MG tablet Take 1 tablet (5 mg total) by mouth daily.   [DISCONTINUED] metoprolol succinate (TOPROL-XL) 25 MG 24 hr tablet Take 1 tablet (25 mg total) by mouth daily. Please keep upcoming appt in April with Dr. Meda Coffee before anymore refills. Thank you   [DISCONTINUED] nitroGLYCERIN (NITROSTAT) 0.4 MG SL tablet PLACE 1 TABLET (0.4 MG TOTAL) UNDER THE TONGUE EVERY 5 (FIVE) MINUTES AS  NEEDED FOR CHEST PAIN.   [DISCONTINUED] pravastatin (PRAVACHOL) 80 MG tablet Take 0.5 tablets (40 mg total) by mouth daily.     Review of Systems    All other systems reviewed and are otherwise negative except as noted above.  Physical Exam    VS:  BP (!) 150/80   Pulse (!) 58   Ht 5\' 7"  (1.702 m)   Wt 151 lb 6.4 oz (68.7 kg)   SpO2 95%   BMI 23.71 kg/m  , BMI Body mass index is 23.71 kg/m.  Wt Readings from Last 3 Encounters:  11/10/20 151 lb 6.4 oz (68.7 kg)  08/27/19 160 lb 3.2 oz (72.7 kg)  05/22/18 167 lb 9.6 oz (76 kg)     GEN: Well nourished, well developed, in no acute distress. HEENT: normal. Neck: Supple, no JVD, carotid bruits, or masses. Cardiac: RRR, no murmurs, rubs, or gallops. No clubbing, cyanosis, edema.  Radials/DP/PT 2+ and equal bilaterally.  Respiratory:  Respirations regular and unlabored, clear to auscultation bilaterally. GI: Soft, nontender, nondistended. MS: No deformity or atrophy. Skin: Warm and dry, no rash. Neuro:  Strength and sensation are intact. Psych: Normal affect.  Assessment & Plan    CAD s/p CABG- Stable with no anginal symptoms. No indication for ischemic evaluation.  GDMT includes aspirin, Toprol, pravastatin, PRN nitroglycerin. Heart healthy diet and regular cardiovascular exercise encouraged.    Hypertension-BP mildly elevated today in clinic in the setting of stress diagnosed well controlled at home.  Educated to report blood pressure consistently greater than 130/80.  Continue current antihypertensive regimen, refills provided.  Hyperlipidemia- 02/2020 LDL 93, normal liver enzymes.  Continue present dose pravastatin 40 mg daily.  Disposition: Follow up in 1 year(s) with Dr. Johney Frame (previous Dr. Meda Coffee patient, establish with Dr. Johney Frame next year)   Signed, Loel Dubonnet, NP 11/10/2020, 2:56 PM [place]

## 2021-03-20 ENCOUNTER — Other Ambulatory Visit: Payer: Self-pay | Admitting: Family

## 2021-03-20 DIAGNOSIS — I25709 Atherosclerosis of coronary artery bypass graft(s), unspecified, with unspecified angina pectoris: Secondary | ICD-10-CM

## 2021-10-26 NOTE — Progress Notes (Unsigned)
Cardiology Office Note:    Date:  10/29/2021   ID:  John [name], DOB 24-May-1934, MRN [id number]  PCP:  Velna Hatchet, MD   [facility] Providers Cardiologist:  Freada Bergeron, MD     Referring MD: Velna Hatchet, MD   Chief Complaint: fatigue  History of Present Illness:    John [name] is a very pleasant 87 y.o. male with a hx of CAD s/p CABG 1998, hypertension, hyperlipidemia, hard of hearing.   History of CAD dating back to CABG 1998.  His anginal equivalent at that time was exertional right shoulder pain and chest tightness.  In 2015 he underwent nuclear stress test with no evidence of ischemia and hypertensive response to stress that improved with initiation of amlodipine.  He was last seen in our office on 11/10/2020 by Laurann Montana, NP.  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 Reported home BP readings were at goal.  No changes were made to his treatment plan and 1 year follow-up was recommended.  Today, he is here with his wife and reports that he does not have as much energy as in the past. States that this has occurred gradually and is unsure of the time frame except to note that it has occurred since last office visit July 2022. 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  When he comes and to rest, wife reports he falls asleep very quickly.  We discussed the possibility of sleep apnea and wife does not feel that he has significant snoring or periods of apnea. He denies chest pain, shortness of breath, lower extremity edema, palpitations, melena, hematuria, hemoptysis, diaphoresis, weakness, presyncope, syncope, orthopnea, and PND.    Past Medical History:  Diagnosis Date   CAD (coronary artery disease)    MI (myocardial infarction) ([place]) 1978   Spinal stenosis    L4-5   Spondylolisthesis    L4-5    Past Surgical History:  Procedure Laterality Date   CORONARY ARTERY BYPASS GRAFT  1999    3 VESSEL   LUMBAR LAMINECTOMY     with partial facetectomy (L2-3, L3-4, L4-5, L5-S1)    Current Medications: Current Meds  Medication Sig   amLODipine (NORVASC) 5 MG tablet Take 1 tablet (5 mg total) by mouth daily.   amoxicillin (AMOXIL) 500 MG capsule Take 500 mg by mouth 3 (three) times daily.   aspirin 81 MG tablet Take 81 mg by mouth daily.   beta carotene w/minerals (OCUVITE) tablet Take 2 tablets by mouth daily.   metoprolol succinate (TOPROL-XL) 25 MG 24 hr tablet TAKE 1 TABLET (25 MG TOTAL) BY MOUTH DAILY.   pravastatin (PRAVACHOL) 80 MG tablet Take 0.5 tablets (40 mg total) by mouth daily.   tamsulosin (FLOMAX) 0.4 MG CAPS capsule Take 0.4 mg by mouth daily.   Vitamin D, Ergocalciferol, (DRISDOL) 50000 units CAPS capsule Take 1 capsule by mouth once a week.   [DISCONTINUED] Fluticasone Furoate 50 MCG/ACT AEPB Place 2 sprays into the nose as needed.   [DISCONTINUED] nitroGLYCERIN (NITROSTAT) 0.4 MG SL tablet PLACE 1 TABLET (0.4 MG TOTAL) UNDER THE TONGUE EVERY 5 (FIVE) MINUTES AS NEEDED FOR CHEST PAIN.     Allergies:   Patient has no known allergies.   Social History   Socioeconomic History   Marital status: Single    Spouse name: Not on file   Number of children: Not on file   Years of education: Not on file  Highest education level: Not on file  Occupational History   Not on file  Tobacco Use   Smoking status: Former    Types: Cigarettes    Quit date: 11/19/1976    Years since quitting: 44.9   Smokeless tobacco: Never  Substance and Sexual Activity   Alcohol use: No   Drug use: No   Sexual activity: Not on file  Other Topics Concern   Not on file  Social History Narrative   Not on file   Social Determinants of Health   Financial Resource Strain: Not on file  Food Insecurity: Not on file  Transportation Needs: Not on file  Physical Activity: Not on file  Stress: Not on file  Social Connections: Not on file     Family History: The patient's family  history is not on file.  ROS:   Please see the history of present illness.    + fatigue All other systems reviewed and are negative.  Labs/Other Studies Reviewed:    The following studies were reviewed today:  Myoview 02/2014  Normal stress test but significantly elevated BP on exertion (230 mmHg) Amlodipine 2.5 mg daily initiated   2D Echo 02/2013  Left ventricle: The cavity size was normal. Wall thickness  was increased in a pattern of mild LVH. Systolic function  was normal. The estimated ejection fraction was in the range  of 55% to 60%. Wall motion was normal; there were no  regional wall motion abnormalities. Features are consistent  with a pseudonormal left ventricular filling pattern, with  concomitant abnormal relaxation and increased filling  pressure (grade 2 diastolic dysfunction).   Recent Labs: No results found for requested labs within last 365 days. Recent Lipid Panel No results found for: "CHOL", "TRIG", "HDL", "CHOLHDL", "VLDL", "LDLCALC", "LDLDIRECT"   Risk Assessment/Calculations:         Physical Exam:    VS:  BP 124/64   Pulse 71   Ht '5\' 7"'$  (1.702 m)   Wt 147 lb 3.2 oz (66.8 kg)   SpO2 95%   BMI 23.05 kg/m     Wt Readings from Last 3 Encounters:  10/29/21 147 lb 3.2 oz (66.8 kg)  11/10/20 151 lb 6.4 oz (68.7 kg)  08/27/19 160 lb 3.2 oz (72.7 kg)     GEN:  Well nourished, well developed in no acute distress HEENT: Normal NECK: No JVD; No carotid bruits CARDIAC: RRR, 2/6 systolic murmur RUSB. No rubs, gallops RESPIRATORY:  Clear to auscultation without rales, wheezing or rhonchi  ABDOMEN: Soft, non-tender, non-distended MUSCULOSKELETAL:  No edema; No deformity. 2+ pedal pulses, equal bilaterally SKIN: Warm and dry NEUROLOGIC:  Alert and oriented x 3 PSYCHIATRIC:  Normal affect   EKG:  EKG is ordered today.  The ekg ordered today demonstrates NSR at 71 bpm, no acute ST/T wave changes  Diagnoses:    1. Essential hypertension   2.  Coronary artery disease involving coronary bypass graft of native heart without angina pectoris   3. Hyperlipidemia LDL goal <70   4. Other fatigue   5. Murmur    Assessment and Plan:     CAD without angina: He had CABG in 1998.  No further coronary intervention since that time.  Normal stress test with significantly elevated BP on exertion in 2015. He denies chest pain, dyspnea, or other symptoms concerning for angina. Could consider that fatigue is other form of angina. Will get echo for evaluation of wall motion abnormality. Will consider stress test if no clear  cause of fatigue on echo.  No bleeding concerns. Continue amlodipine, aspirin, metoprolol, pravastatin.  Hypertension: BP is well-controlled. He does not monitor on a consistent basis. No changes today.   Murmur: Has 2/6 systolic murmur most audible RUSB.  No significant valve disease by echo in 2014.  We will update echocardiogram  Fatigue: Increasing fatigue over the previous few months.  Remains active but feels that he has to rest more frequently. Weight is down 4 lbs in one year - he continues to have a good appetite. We will check echocardiogram for heart or valve dysfunction.  We will get CBC to evaluate for anemia, TSH for evaluation of thyroid dysfunction, BMET for electrolyte disturbance.   Hyperlipidemia LDL goal < 70: LDL 93 October 2022.  Has been intolerant of higher intensity statins.  He eats a healthy diet and remains physically active.  Continue pravastatin  Disposition: 2 months with APP  Medication Adjustments/Labs and Tests Ordered: Current medicines are reviewed at length with the patient today.  Concerns regarding medicines are outlined above.  Orders Placed This Encounter  Procedures   TSH   Basic Metabolic Panel (BMET)   CBC   EKG 12-Lead   ECHOCARDIOGRAM COMPLETE   Meds ordered this encounter  Medications   nitroGLYCERIN (NITROSTAT) 0.4 MG SL tablet    Sig: PLACE 1 TABLET (0.4 MG TOTAL) UNDER THE  TONGUE EVERY 5 (FIVE) MINUTES AS NEEDED FOR CHEST PAIN.    Dispense:  25 tablet    Refill:  4   Fluticasone Furoate 50 MCG/ACT AEPB    Sig: Place 2 sprays into the nose as needed.    Dispense:  60 each    Refill:  6    Patient Instructions  Medication Instructions:   Your physician recommends that you continue on your current medications as directed. Please refer to the Current Medication list given to you today.   *If you need a refill on your cardiac medications before your next appointment, please call your pharmacy*   Lab Work:  TODAY!!!!!BMET/CBC/TSH  If you have labs (blood work) drawn today and your tests are completely normal, you will receive your results only by: [place] (if you have MyChart) OR A paper copy in the mail If you have any lab test that is abnormal or we need to change your treatment, we will call you to review the results.   Testing/Procedures:  Your physician has requested that you have an echocardiogram. Echocardiography is a painless test that uses sound waves to create images of your heart. It provides your doctor with information about the size and shape of your heart and how well your heart's chambers and valves are working. This procedure takes approximately one hour. There are no restrictions for this procedure. FRIDAY, JULY 7 @ 11:15 AM.     Follow-Up: At [facility], you and your health needs are our priority.  As part of our continuing mission to provide you with exceptional heart care, we have created designated Provider Care Teams.  These Care Teams include your primary Cardiologist (physician) and Advanced Practice Providers (APPs -  Physician Assistants and Nurse Practitioners) who all work together to provide you with the care you need, when you need it.  We recommend signing up for the patient portal called "MyChart".  Sign up information is provided on this After Visit Summary.  MyChart is used to connect with patients for  Virtual Visits (Telemedicine).  Patients are able to view lab/test results, encounter notes, upcoming appointments,  etc.  Non-urgent messages can be sent to your provider as well.   To learn more about what you can do with MyChart, go to [email or web address].    Your next appointment:   2 month(s)  The format for your next appointment:   In Person  Provider:   Christen Bame, NP         Important Information About Sugar         Signed, Emmaline Life, NP  10/29/2021 1:49 PM    [place]

## 2021-10-29 ENCOUNTER — Other Ambulatory Visit: Payer: Self-pay | Admitting: *Deleted

## 2021-10-29 ENCOUNTER — Encounter: Payer: Self-pay | Admitting: Nurse Practitioner

## 2021-10-29 ENCOUNTER — Other Ambulatory Visit: Payer: Self-pay | Admitting: Nurse Practitioner

## 2021-10-29 ENCOUNTER — Ambulatory Visit: Payer: Medicare Other | Admitting: Nurse Practitioner

## 2021-10-29 VITALS — BP 124/64 | HR 71 | Ht 67.0 in | Wt 147.2 lb

## 2021-10-29 DIAGNOSIS — I25810 Atherosclerosis of coronary artery bypass graft(s) without angina pectoris: Secondary | ICD-10-CM

## 2021-10-29 DIAGNOSIS — R5383 Other fatigue: Secondary | ICD-10-CM

## 2021-10-29 DIAGNOSIS — I10 Essential (primary) hypertension: Secondary | ICD-10-CM | POA: Diagnosis not present

## 2021-10-29 DIAGNOSIS — E785 Hyperlipidemia, unspecified: Secondary | ICD-10-CM | POA: Diagnosis not present

## 2021-10-29 DIAGNOSIS — R011 Cardiac murmur, unspecified: Secondary | ICD-10-CM

## 2021-10-29 LAB — CBC
Hematocrit: 41.7 % (ref 37.5–51.0)
Hemoglobin: 14.7 g/dL (ref 13.0–17.7)
MCH: 32.5 pg (ref 26.6–33.0)
MCHC: 35.3 g/dL (ref 31.5–35.7)
MCV: 92 fL (ref 79–97)
Platelets: 284 10*3/uL (ref 150–450)
RBC: 4.53 x10E6/uL (ref 4.14–5.80)
RDW: 12 % (ref 11.6–15.4)
WBC: 7.6 10*3/uL (ref 3.4–10.8)

## 2021-10-29 LAB — BASIC METABOLIC PANEL
BUN/Creatinine Ratio: 10 (ref 10–24)
BUN: 8 mg/dL (ref 8–27)
CO2: 26 mmol/L (ref 20–29)
Calcium: 10.3 mg/dL — ABNORMAL HIGH (ref 8.6–10.2)
Chloride: 97 mmol/L (ref 96–106)
Creatinine, Ser: 0.78 mg/dL (ref 0.76–1.27)
Glucose: 93 mg/dL (ref 70–99)
Potassium: 4.9 mmol/L (ref 3.5–5.2)
Sodium: 138 mmol/L (ref 134–144)
eGFR: 88 mL/min/{1.73_m2} (ref >59)

## 2021-10-29 LAB — TSH: TSH: 1.19 u[IU]/mL (ref 0.450–4.500)

## 2021-10-29 MED ORDER — NITROGLYCERIN 0.4 MG SL SUBL: SUBLINGUAL_TABLET | SUBLINGUAL | 4 refills | Status: DC

## 2021-10-29 MED ORDER — FLUTICASONE PROPIONATE 50 MCG/ACT NA SUSP: 2.0000 | NASAL | 2 refills | Status: DC | PRN

## 2021-10-29 MED ORDER — FLUTICASONE FUROATE 50 MCG/ACT IN AEPB: 2.0000 | INHALATION_SPRAY | RESPIRATORY_TRACT | 6 refills | Status: DC | PRN

## 2021-10-29 NOTE — Patient Instructions (Addendum)
Medication Instructions:   Your physician recommends that you continue on your current medications as directed. Please refer to the Current Medication list given to you today.   *If you need a refill on your cardiac medications before your next appointment, please call your pharmacy*   Lab Work:  TODAY!!!!!BMET/CBC/TSH  If you have labs (blood work) drawn today and your tests are completely normal, you will receive your results only by: [place] (if you have MyChart) OR A paper copy in the mail If you have any lab test that is abnormal or we need to change your treatment, we will call you to review the results.   Testing/Procedures:  Your physician has requested that you have an echocardiogram. Echocardiography is a painless test that uses sound waves to create images of your heart. It provides your doctor with information about the size and shape of your heart and how well your heart's chambers and valves are working. This procedure takes approximately one hour. There are no restrictions for this procedure. FRIDAY, JULY 7 @ 11:15 AM.     Follow-Up: At [facility], you and your health needs are our priority.  As part of our continuing mission to provide you with exceptional heart care, we have created designated Provider Care Teams.  These Care Teams include your primary Cardiologist (physician) and Advanced Practice Providers (APPs -  Physician Assistants and Nurse Practitioners) who all work together to provide you with the care you need, when you need it.  We recommend signing up for the patient portal called "MyChart".  Sign up information is provided on this After Visit Summary.  MyChart is used to connect with patients for Virtual Visits (Telemedicine).  Patients are able to view lab/test results, encounter notes, upcoming appointments, etc.  Non-urgent messages can be sent to your provider as well.   To learn more about what you can do with MyChart, go to  [email or web address].    Your next appointment:   2 month(s)  The format for your next appointment:   In Person  Provider:   Christen Bame, NP         Important Information About Sugar

## 2021-11-16 ENCOUNTER — Ambulatory Visit (HOSPITAL_COMMUNITY): Payer: Medicare Other | Attending: Cardiology

## 2021-11-16 DIAGNOSIS — I10 Essential (primary) hypertension: Secondary | ICD-10-CM | POA: Insufficient documentation

## 2021-11-16 DIAGNOSIS — I25810 Atherosclerosis of coronary artery bypass graft(s) without angina pectoris: Secondary | ICD-10-CM | POA: Insufficient documentation

## 2021-11-16 DIAGNOSIS — E785 Hyperlipidemia, unspecified: Secondary | ICD-10-CM | POA: Diagnosis not present

## 2021-11-16 LAB — ECHOCARDIOGRAM COMPLETE
Area-P 1/2: 2.92 cm2
S' Lateral: 3.4 cm

## 2021-11-19 ENCOUNTER — Telehealth: Payer: Self-pay | Admitting: Nurse Practitioner

## 2021-11-19 NOTE — Telephone Encounter (Signed)
John Life, NP  11/18/2021  Provider Echo Result Note     Pumping function is normal. No regional wall motion abnormalities. Delayed relaxation (Grade 2 diastolic dysfunction) which could contribute to some shortness of breath or activity intolerance. Would recommend low sodium diet (no more than 2000 mg daily), good hydration, continued good blood pressure control. Watch for evidence of fluid overload (swelling in extremities or abdomen, having to elevate head when lying down). He was reporting fatigue at the office visit which this would not necessarily explain. If symptoms persist, we could try a low dose diuretic.    The details of Ms. John Bame, NP - provider Echo Result Note, as stated above, were shared with Mrs John [name], Mr. John [name]'s wife on 11/19/21 at 74 pm.     Mrs. John [name] stated that they will watch out for lower extremity edema, maintain healthy hydration, and try to keep his sodium intake at 2000 mg daily.  She will continue to monitor dyspnea on exertion, and seek a providers care for new or worsening symptoms.    Pt understood the providers education, and no f/u is required at this time.

## 2021-11-19 NOTE — Telephone Encounter (Signed)
Patient's spouse is returning CMA's call regarding Echo results. Please advise.

## 2021-12-20 ENCOUNTER — Other Ambulatory Visit: Payer: Self-pay | Admitting: Family

## 2021-12-20 DIAGNOSIS — I25709 Atherosclerosis of coronary artery bypass graft(s), unspecified, with unspecified angina pectoris: Secondary | ICD-10-CM

## 2021-12-20 NOTE — Telephone Encounter (Signed)
Pt of Dr. Pemberton. Please review for refill. Thank you! 

## 2021-12-31 NOTE — Progress Notes (Unsigned)
Cardiology Office Note:    Date:  01/03/2022   ID:  John [name], DOB [birth year], MRN [id number]  PCP:  Velna Hatchet, MD   [facility] Providers Cardiologist:  Freada Bergeron, MD     Referring MD: Velna Hatchet, MD   Chief Complaint: fatigue  History of Present Illness:    John [name] is a very pleasant 87 y.o. male with a hx of CAD s/p CABG 1998, hypertension, hyperlipidemia, hard of hearing.   History of CAD dating back to CABG 1998.  His anginal equivalent at that time was exertional right shoulder pain and chest tightness.  In 2015 he underwent nuclear stress test with no evidence of ischemia and hypertensive response to stress that improved with initiation of amlodipine.  Seen on 11/10/2020 by Laurann Montana, NP.  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 Reported home BP readings were at goal.  No changes were made to his treatment plan and 1 year follow-up was recommended.  He was last seen in our office by me on 10/29/21 with his wife and reported not having as much energy as in the past. Stated has occurred gradually and is unsure of the time frame except to note that it has occurred since last office visit July 2022. 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 When he rests, he falls asleep very quickly.  We discussed the possibility of sleep apnea and wife does not feel that he has significant snoring or periods of apnea. He denied chest pain, shortness of breath, lower extremity edema, palpitations, melena, hematuria, hemoptysis, diaphoresis, weakness, presyncope, syncope, orthopnea, and PND. Echocardiogram was updated and revealed normal LVEF 55 to 60%, no RWMA, grade 2 diastolic dysfunction mildly dilated LA, mild MR, AV structurally tricuspid but functionally bicuspid with fusion of left and noncoronary cusp.  Moderate calcification of aortic valve without any evidence of stenosis.  Today, he is  here with his wife and reports that he is feeling about the same as at previous appointment. He continues to have fatigue but it seems as if this occurs after being active and is likely age-related deconditioning.  He denies chest pain, shortness of breath, presyncope, syncope, orthopnea, PND. No bleeding concerns. Home BP is typically in the range of 138/64 mmHg.   Past Medical History:  Diagnosis Date   CAD (coronary artery disease)    MI (myocardial infarction) ([place]) 1978   Spinal stenosis    L4-5   Spondylolisthesis    L4-5    Past Surgical History:  Procedure Laterality Date   CORONARY ARTERY BYPASS GRAFT  1999   3 VESSEL   LUMBAR LAMINECTOMY     with partial facetectomy (L2-3, L3-4, L4-5, L5-S1)    Current Medications: Current Meds  Medication Sig   amLODipine (NORVASC) 5 MG tablet Take 1 tablet (5 mg total) by mouth daily.   amoxicillin (AMOXIL) 500 MG capsule Take 500 mg by mouth 3 (three) times daily.   aspirin 81 MG tablet Take 81 mg by mouth daily.   beta carotene w/minerals (OCUVITE) tablet Take 2 tablets by mouth daily.   fluticasone (FLONASE) 50 MCG/ACT nasal spray Place 2 sprays into both nostrils as needed for allergies or rhinitis.   Fluticasone Furoate 50 MCG/ACT AEPB Place 2 sprays into the nose as needed.   metoprolol succinate (TOPROL-XL) 25 MG 24 hr tablet TAKE 1 TABLET (25 MG TOTAL) BY MOUTH DAILY.   nitroGLYCERIN (NITROSTAT) 0.4 MG  SL tablet PLACE 1 TABLET (0.4 MG TOTAL) UNDER THE TONGUE EVERY 5 (FIVE) MINUTES AS NEEDED FOR CHEST PAIN.   pravastatin (PRAVACHOL) 80 MG tablet Take 0.5 tablets (40 mg total) by mouth daily.   tamsulosin (FLOMAX) 0.4 MG CAPS capsule Take 0.4 mg by mouth daily.   Vitamin D, Ergocalciferol, (DRISDOL) 50000 units CAPS capsule Take 1 capsule by mouth once a week.     Allergies:   Patient has no known allergies.   Social History   Socioeconomic History   Marital status: Single    Spouse name: Not on file   Number of children:  Not on file   Years of education: Not on file   Highest education level: Not on file  Occupational History   Not on file  Tobacco Use   Smoking status: Former    Types: Cigarettes    Quit date: 11/19/1976    Years since quitting: 45.1   Smokeless tobacco: Never  Substance and Sexual Activity   Alcohol use: No   Drug use: No   Sexual activity: Not on file  Other Topics Concern   Not on file  Social History Narrative   Not on file   Social Determinants of Health   Financial Resource Strain: Not on file  Food Insecurity: Not on file  Transportation Needs: Not on file  Physical Activity: Not on file  Stress: Not on file  Social Connections: Not on file     Family History: The patient's family history is not on file.  ROS:   Please see the history of present illness.    + fatigue All other systems reviewed and are negative.  Labs/Other Studies Reviewed:    The following studies were reviewed today:  Echo 11/16/21   1. Left ventricular ejection fraction, by estimation, is 55 to 60%. The  left ventricle has normal function. The left ventricle has no regional  wall motion abnormalities. Left ventricular diastolic parameters are  consistent with Grade II diastolic  dysfunction (pseudonormalization). Elevated left ventricular end-diastolic  pressure.   2. Right ventricular systolic function is normal. The right ventricular  size is normal. Tricuspid regurgitation signal is inadequate for assessing  PA pressure.   3. Left atrial size was mildly dilated.   4. The mitral valve is normal in structure. Mild mitral valve  regurgitation. No evidence of mitral stenosis.   5. The AV appears structurally tricupid but functionally bicuspid with  fusion of the left and non coronary cusps. . The aortic valve is abnormal.  There is moderate calcification of the aortic valve. Aortic valve  regurgitation is not visualized. Aortic  valve sclerosis/calcification is present, without any  evidence of aortic  stenosis.   6. The inferior vena cava was not well visualized.    Myoview 02/2014  Normal stress test but significantly elevated BP on exertion (230 mmHg) Amlodipine 2.5 mg daily initiated   2D Echo 02/2013  Left ventricle: The cavity size was normal. Wall thickness  was increased in a pattern of mild LVH. Systolic function  was normal. The estimated ejection fraction was in the range  of 55% to 60%. Wall motion was normal; there were no  regional wall motion abnormalities. Features are consistent  with a pseudonormal left ventricular filling pattern, with  concomitant abnormal relaxation and increased filling  pressure (grade 2 diastolic dysfunction).   Recent Labs: 10/29/2021: BUN 8; Creatinine, Ser 0.78; Hemoglobin 14.7; Platelets 284; Potassium 4.9; Sodium 138; TSH 1.190 Recent Lipid Panel No results  found for: "CHOL", "TRIG", "HDL", "CHOLHDL", "VLDL", "LDLCALC", "LDLDIRECT"   Risk Assessment/Calculations:         Physical Exam:    VS:  BP 118/72   Pulse 60   Ht '5\' 7"'$  (1.702 m)   Wt 150 lb (68 kg)   SpO2 96%   BMI 23.49 kg/m     Wt Readings from Last 3 Encounters:  01/02/22 150 lb (68 kg)  10/29/21 147 lb 3.2 oz (66.8 kg)  11/10/20 151 lb 6.4 oz (68.7 kg)     GEN:  Well nourished, well developed in no acute distress HEENT: Normal NECK: No JVD; No carotid bruits CARDIAC: RRR, 2/6 systolic murmur RUSB. No rubs, gallops RESPIRATORY:  Clear to auscultation without rales, wheezing or rhonchi  ABDOMEN: Soft, non-tender, non-distended MUSCULOSKELETAL:  No edema; No deformity. 2+ pedal pulses, equal bilaterally SKIN: Warm and dry NEUROLOGIC:  Alert and oriented x 3 PSYCHIATRIC:  Normal affect   EKG:  EKG is ordered today.  The ekg ordered today demonstrates NSR at 71 bpm, no acute ST/T wave changes  Diagnoses:    1. Coronary artery disease involving native coronary artery of native heart without angina pectoris   2. Murmur   3. Essential  hypertension   4. Other fatigue   5. Hyperlipidemia LDL goal <70   6. Aortic valve sclerosis     Assessment and Plan:     CAD without angina: S/p CABG in 1998. No further coronary intervention since that time. Normal stress test with significantly elevated BP on exertion in 2015. He denies chest pain, dyspnea, or other symptoms concerning for angina. Symptom of fatigue seems to occur after physical labor and sounds appropriate for his age. Advised him to notify us of worsening symptoms prior to next appointment.  Hypertension: BP is well-controlled. Home BP is well-controlled.    Aortic valve/Murmur: Soft 2/6 systolic murmur on exam. On echo 7/7, AV appears structurally tricuspid but functionally bicuspid with fusion of the left and non coronary cusps. Moderate calcification of the aortic valve, no AI. Aortic valve sclerosis/calcification is present, without any evidence of aortic  stenosis. He is asymptomatic. Will continue to monitor with echo every 1-2 years, sooner if clinically indicated.   Fatigue: Fatigue has increased over previous 6 months. He continues to be physically active and fatigue most often occurs at end of a busy day. Weight has improved at this visit, has increased protein intake.  CBC, TSH, BMET all stable on 6/19.  Has upcoming lab work with PCP in October. As noted above, we are monitoring aortic sclerosis, there is no stenosis presently.  We will follow-up in 6 months.  Hyperlipidemia LDL goal < 70: LDL 93 October 2022. Has upcoming fasting labs with Dr. Ardeth Perfect. Intolerant of higher doses of statins. Continue pravastatin.   Disposition: 6 months with Dr. Johney Frame  Medication Adjustments/Labs and Tests Ordered: Current medicines are reviewed at length with the patient today.  Concerns regarding medicines are outlined above.  No orders of the defined types were placed in this encounter.  No orders of the defined types were placed in this encounter.   Patient  Instructions  Medication Instructions:   Your physician recommends that you continue on your current medications as directed. Please refer to the Current Medication list given to you today.   *If you need a refill on your cardiac medications before your next appointment, please call your pharmacy*   Lab Work:  None ordered.  If you have labs (blood work)  drawn today and your tests are completely normal, you will receive your results only by: MyChart Message (if you have MyChart) OR A paper copy in the mail If you have any lab test that is abnormal or we need to change your treatment, we will call you to review the results.   Testing/Procedures:  None ordered.   Follow-Up: At [facility], you and your health needs are our priority.  As part of our continuing mission to provide you with exceptional heart care, we have created designated Provider Care Teams.  These Care Teams include your primary Cardiologist (physician) and Advanced Practice Providers (APPs -  Physician Assistants and Nurse Practitioners) who all work together to provide you with the care you need, when you need it.  We recommend signing up for the patient portal called "MyChart".  Sign up information is provided on this After Visit Summary.  MyChart is used to connect with patients for Virtual Visits (Telemedicine).  Patients are able to view lab/test results, encounter notes, upcoming appointments, etc.  Non-urgent messages can be sent to your provider as well.   To learn more about what you can do with MyChart, go to [email or web address].    Your next appointment:   6 month(s)  The format for your next appointment:   In Person  Provider:   Freada Bergeron, MD     Important Information About Sugar         Signed, Emmaline Life, NP  01/03/2022 11:40 AM    [place]

## 2022-01-02 ENCOUNTER — Ambulatory Visit: Payer: Medicare Other | Admitting: Nurse Practitioner

## 2022-01-02 ENCOUNTER — Encounter: Payer: Self-pay | Admitting: Nurse Practitioner

## 2022-01-02 VITALS — BP 118/72 | HR 60 | Ht 67.0 in | Wt 150.0 lb

## 2022-01-02 DIAGNOSIS — I10 Essential (primary) hypertension: Secondary | ICD-10-CM

## 2022-01-02 DIAGNOSIS — R011 Cardiac murmur, unspecified: Secondary | ICD-10-CM

## 2022-01-02 DIAGNOSIS — R5383 Other fatigue: Secondary | ICD-10-CM

## 2022-01-02 DIAGNOSIS — I2510 Atherosclerotic heart disease of native coronary artery without angina pectoris: Secondary | ICD-10-CM

## 2022-01-02 DIAGNOSIS — I358 Other nonrheumatic aortic valve disorders: Secondary | ICD-10-CM

## 2022-01-02 DIAGNOSIS — E785 Hyperlipidemia, unspecified: Secondary | ICD-10-CM

## 2022-01-02 NOTE — Patient Instructions (Signed)
Medication Instructions:   Your physician recommends that you continue on your current medications as directed. Please refer to the Current Medication list given to you today.   *If you need a refill on your cardiac medications before your next appointment, please call your pharmacy*   Lab Work:  None ordered.  If you have labs (blood work) drawn today and your tests are completely normal, you will receive your results only by: [place] (if you have MyChart) OR A paper copy in the mail If you have any lab test that is abnormal or we need to change your treatment, we will call you to review the results.   Testing/Procedures:  None ordered.   Follow-Up: At [facility], you and your health needs are our priority.  As part of our continuing mission to provide you with exceptional heart care, we have created designated Provider Care Teams.  These Care Teams include your primary Cardiologist (physician) and Advanced Practice Providers (APPs -  Physician Assistants and Nurse Practitioners) who all work together to provide you with the care you need, when you need it.  We recommend signing up for the patient portal called "MyChart".  Sign up information is provided on this After Visit Summary.  MyChart is used to connect with patients for Virtual Visits (Telemedicine).  Patients are able to view lab/test results, encounter notes, upcoming appointments, etc.  Non-urgent messages can be sent to your provider as well.   To learn more about what you can do with MyChart, go to [email or web address].    Your next appointment:   6 month(s)  The format for your next appointment:   In Person  Provider:   Freada Bergeron, MD     Important Information About Sugar

## 2022-01-03 ENCOUNTER — Encounter: Payer: Self-pay | Admitting: Nurse Practitioner

## 2022-01-03 ENCOUNTER — Ambulatory Visit: Payer: Medicare Other | Admitting: Nurse Practitioner

## 2022-06-30 NOTE — Progress Notes (Unsigned)
Cardiology Office Note:    Date:  07/03/2022   ID:  John [name], DOB 1934/05/27, MRN [id number]  PCP:  Velna Hatchet, MD   [place] Providers Cardiologist:  Freada Bergeron, MD   Referring MD: Velna Hatchet, MD    History of Present Illness:    John [name] is a 88 y.o. male with a hx of CAD s/p CABG 1998, hypertension, and hyperlipidemia who presents to clinic for follow-up.  Per review of the record, patient has history of CABG in 1998. In 2015 he underwent nuclear stress test with no evidence of ischemia and hypertensive response to stress that improved with initiation of amlodipine. TTE 11/2021 with LVEF 55 to 60%, no RWMA, grade 2 diastolic dysfunction mildly dilated LA, mild MR, AV structurally tricuspid but functionally bicuspid with fusion of left and noncoronary cusp.  Moderate calcification of aortic valve without any evidence of stenosis.   Was last seen by John [name] 12/2021 where he was feeling well.   Today, the patient states that her overall feels well today. Has been having trouble with his sinuses but no chest pain, SOB, LE edema, orthopnea or PND. Has occasional dyspnea on exertion. Tolerating medications as prescribed. Blood pressure is well controlled in 120s at home. Has not required his nitro.   Past Medical History:  Diagnosis Date   CAD (coronary artery disease)    MI (myocardial infarction) ([place]) 1978   Spinal stenosis    L4-5   Spondylolisthesis    L4-5    Past Surgical History:  Procedure Laterality Date   CORONARY ARTERY BYPASS GRAFT  1999   3 VESSEL   LUMBAR LAMINECTOMY     with partial facetectomy (L2-3, L3-4, L4-5, L5-S1)    Current Medications: Current Meds  Medication Sig   amLODipine (NORVASC) 5 MG tablet Take 1 tablet (5 mg total) by mouth daily.   amoxicillin (AMOXIL) 500 MG capsule Take 500 mg by mouth 3 (three) times daily.   aspirin 81 MG tablet Take 81 mg by mouth daily.   beta carotene  w/minerals (OCUVITE) tablet Take 2 tablets by mouth daily.   Fluticasone Furoate 50 MCG/ACT AEPB Place 2 sprays into the nose as needed.   metoprolol succinate (TOPROL-XL) 25 MG 24 hr tablet TAKE 1 TABLET (25 MG TOTAL) BY MOUTH DAILY.   nitroGLYCERIN (NITROSTAT) 0.4 MG SL tablet PLACE 1 TABLET (0.4 MG TOTAL) UNDER THE TONGUE EVERY 5 (FIVE) MINUTES AS NEEDED FOR CHEST PAIN.   pravastatin (PRAVACHOL) 80 MG tablet Take 0.5 tablets (40 mg total) by mouth daily.   tamsulosin (FLOMAX) 0.4 MG CAPS capsule Take 0.4 mg by mouth daily.   Vitamin D, Ergocalciferol, (DRISDOL) 50000 units CAPS capsule Take 1 capsule by mouth once a week.     Allergies:   Patient has no known allergies.   Social History   Socioeconomic History   Marital status: Single    Spouse name: Not on file   Number of children: Not on file   Years of education: Not on file   Highest education level: Not on file  Occupational History   Not on file  Tobacco Use   Smoking status: Former    Types: Cigarettes    Quit date: 11/19/1976    Years since quitting: 45.6   Smokeless tobacco: Never  Substance and Sexual Activity   Alcohol use: No   Drug use: No   Sexual activity: Not on file  Other Topics Concern   Not on  file  Social History Narrative   Not on file   Social Determinants of Health   Financial Resource Strain: Not on file  Food Insecurity: Not on file  Transportation Needs: Not on file  Physical Activity: Not on file  Stress: Not on file  Social Connections: Not on file     Family History: The patient's family history is not on file.  ROS:   Please see the history of present illness.     All other systems reviewed and are negative.  EKGs/Labs/Other Studies Reviewed:    The following studies were reviewed today: Echo 11/16/21    1. Left ventricular ejection fraction, by estimation, is 55 to 60%. The  left ventricle has normal function. The left ventricle has no regional  wall motion abnormalities.  Left ventricular diastolic parameters are  consistent with Grade II diastolic  dysfunction (pseudonormalization). Elevated left ventricular end-diastolic  pressure.   2. Right ventricular systolic function is normal. The right ventricular  size is normal. Tricuspid regurgitation signal is inadequate for assessing  PA pressure.   3. Left atrial size was mildly dilated.   4. The mitral valve is normal in structure. Mild mitral valve  regurgitation. No evidence of mitral stenosis.   5. The AV appears structurally tricupid but functionally bicuspid with  fusion of the left and non coronary cusps. . The aortic valve is abnormal.  There is moderate calcification of the aortic valve. Aortic valve  regurgitation is not visualized. Aortic  valve sclerosis/calcification is present, without any evidence of aortic  stenosis.   6. The inferior vena cava was not well visualized.      Myoview 02/2014   Normal stress test but significantly elevated BP on exertion (230 mmHg) Amlodipine 2.5 mg daily initiated     2D Echo 02/2013   Left ventricle: The cavity size was normal. Wall thickness  was increased in a pattern of mild LVH. Systolic function  was normal. The estimated ejection fraction was in the range  of 55% to 60%. Wall motion was normal; there were no  regional wall motion abnormalities. Features are consistent  with a pseudonormal left ventricular filling pattern, with  concomitant abnormal relaxation and increased filling  pressure (grade 2 diastolic dysfunction).   EKG:  EKG is not ordered today.  The ekg ordered today demonstrates   Recent Labs: 10/29/2021: BUN 8; Creatinine, Ser 0.78; Hemoglobin 14.7; Platelets 284; Potassium 4.9; Sodium 138; TSH 1.190  Recent Lipid Panel No results found for: "CHOL", "TRIG", "HDL", "CHOLHDL", "VLDL", "LDLCALC", "LDLDIRECT"   Risk Assessment/Calculations:      Physical Exam:    VS:  BP (!) 140/70   Pulse 65   Ht 5' 7"$  (1.702 m)   Wt 155  lb 9.6 oz (70.6 kg)   SpO2 97%   BMI 24.37 kg/m     Wt Readings from Last 3 Encounters:  07/03/22 155 lb 9.6 oz (70.6 kg)  01/02/22 150 lb (68 kg)  10/29/21 147 lb 3.2 oz (66.8 kg)     GEN:  Well nourished, well developed in no acute distress. Hard of hearing HEENT: Normal NECK: No JVD; No carotid bruits CARDIAC: RRR, no murmurs, rubs, gallops RESPIRATORY:  Clear to auscultation without rales, wheezing or rhonchi  ABDOMEN: Soft, non-tender, non-distended MUSCULOSKELETAL:  No edema; No deformity  SKIN: Warm and dry NEUROLOGIC:  Alert and oriented x 3 PSYCHIATRIC:  Normal affect   ASSESSMENT:    1. Coronary artery disease involving native coronary artery of native  heart without angina pectoris   2. Essential hypertension   3. Aortic valve sclerosis   4. Hyperlipidemia LDL goal <70    PLAN:    In order of problems listed above:  #CAD s/p CABG in 1998: Doing well without anginal symptoms.  -Continue ASA 33m daily -Continue metop 271mXL daily -Continue pravastatin 8055maily  #HTN: Well controlled at home. -Continue amlodipine 5mg66mily -Continue metop 25mg49mdaily  #HLD: -Continue pravastatin 40mg 45my -LDL 97 but does not wish to increase meds now        Medication Adjustments/Labs and Tests Ordered: Current medicines are reviewed at length with the patient today.  Concerns regarding medicines are outlined above.  No orders of the defined types were placed in this encounter.  No orders of the defined types were placed in this encounter.   There are no Patient Instructions on file for this visit.   Signed, HeatheFreada Bergeron2/21/2024 1:37 PM    [place]

## 2022-07-03 ENCOUNTER — Ambulatory Visit: Payer: Medicare Other | Attending: Cardiology | Admitting: Cardiology

## 2022-07-03 ENCOUNTER — Encounter: Payer: Self-pay | Admitting: Cardiology

## 2022-07-03 VITALS — BP 140/70 | HR 65 | Ht 67.0 in | Wt 155.6 lb

## 2022-07-03 DIAGNOSIS — I10 Essential (primary) hypertension: Secondary | ICD-10-CM | POA: Diagnosis not present

## 2022-07-03 DIAGNOSIS — E785 Hyperlipidemia, unspecified: Secondary | ICD-10-CM | POA: Diagnosis not present

## 2022-07-03 DIAGNOSIS — I358 Other nonrheumatic aortic valve disorders: Secondary | ICD-10-CM

## 2022-07-03 DIAGNOSIS — I2510 Atherosclerotic heart disease of native coronary artery without angina pectoris: Secondary | ICD-10-CM | POA: Diagnosis not present

## 2022-07-03 NOTE — Patient Instructions (Signed)
Medication Instructions:   Your physician recommends that you continue on your current medications as directed. Please refer to the Current Medication list given to you today.  *If you need a refill on your cardiac medications before your next appointment, please call your pharmacy*    Follow-Up: At [facility], you and your health needs are our priority.  As part of our continuing mission to provide you with exceptional heart care, we have created designated Provider Care Teams.  These Care Teams include your primary Cardiologist (physician) and Advanced Practice Providers (APPs -  Physician Assistants and Nurse Practitioners) who all work together to provide you with the care you need, when you need it.  We recommend signing up for the patient portal called "MyChart".  Sign up information is provided on this After Visit Summary.  MyChart is used to connect with patients for Virtual Visits (Telemedicine).  Patients are able to view lab/test results, encounter notes, upcoming appointments, etc.  Non-urgent messages can be sent to your provider as well.   To learn more about what you can do with MyChart, go to [email or web address].    Your next appointment:   6 month(s)  Provider:   Freada Bergeron, MD

## 2022-08-30 ENCOUNTER — Observation Stay (HOSPITAL_COMMUNITY)
Admission: EM | Admit: 2022-08-30 | Discharge: 2022-08-31 | Disposition: A | Discharge status: Home / Self Care | Payer: Medicare Other | Attending: Internal Medicine | Admitting: Internal Medicine

## 2022-08-30 ENCOUNTER — Other Ambulatory Visit: Payer: Self-pay

## 2022-08-30 ENCOUNTER — Emergency Department (HOSPITAL_COMMUNITY): Payer: Medicare Other

## 2022-08-30 DIAGNOSIS — I10 Essential (primary) hypertension: Secondary | ICD-10-CM | POA: Diagnosis not present

## 2022-08-30 DIAGNOSIS — I2510 Atherosclerotic heart disease of native coronary artery without angina pectoris: Secondary | ICD-10-CM | POA: Insufficient documentation

## 2022-08-30 DIAGNOSIS — Z79899 Other long term (current) drug therapy: Secondary | ICD-10-CM | POA: Diagnosis not present

## 2022-08-30 DIAGNOSIS — R0789 Other chest pain: Secondary | ICD-10-CM | POA: Diagnosis present

## 2022-08-30 DIAGNOSIS — R001 Bradycardia, unspecified: Secondary | ICD-10-CM

## 2022-08-30 DIAGNOSIS — Z7982 Long term (current) use of aspirin: Secondary | ICD-10-CM | POA: Insufficient documentation

## 2022-08-30 DIAGNOSIS — Z87891 Personal history of nicotine dependence: Secondary | ICD-10-CM | POA: Insufficient documentation

## 2022-08-30 DIAGNOSIS — R079 Chest pain, unspecified: Secondary | ICD-10-CM

## 2022-08-30 DIAGNOSIS — Z951 Presence of aortocoronary bypass graft: Secondary | ICD-10-CM | POA: Diagnosis not present

## 2022-08-30 LAB — BASIC METABOLIC PANEL
Anion gap: 10 (ref 5–15)
BUN: 7 mg/dL — ABNORMAL LOW (ref 8–23)
CO2: 23 mmol/L (ref 22–32)
Calcium: 8.2 mg/dL — ABNORMAL LOW (ref 8.9–10.3)
Chloride: 99 mmol/L (ref 98–111)
Creatinine, Ser: 0.69 mg/dL (ref 0.61–1.24)
GFR, Estimated: >60 mL/min (ref >60)
Glucose, Bld: 88 mg/dL (ref 70–99)
Potassium: 3.9 mmol/L (ref 3.5–5.1)
Sodium: 132 mmol/L — ABNORMAL LOW (ref 135–145)

## 2022-08-30 LAB — TROPONIN I (HIGH SENSITIVITY)
Troponin I (High Sensitivity): 6 ng/L (ref <18)
Troponin I (High Sensitivity): 6 ng/L (ref <18)

## 2022-08-30 LAB — CBC
HCT: 36.6 % — ABNORMAL LOW (ref 39.0–52.0)
Hemoglobin: 12.8 g/dL — ABNORMAL LOW (ref 13.0–17.0)
MCH: 33 pg (ref 26.0–34.0)
MCHC: 35 g/dL (ref 30.0–36.0)
MCV: 94.3 fL (ref 80.0–100.0)
Platelets: 222 10*3/uL (ref 150–400)
RBC: 3.88 MIL/uL — ABNORMAL LOW (ref 4.22–5.81)
RDW: 12.9 % (ref 11.5–15.5)
WBC: 8.9 10*3/uL (ref 4.0–10.5)
nRBC: 0 % (ref 0.0–0.2)

## 2022-08-30 MED ORDER — NITROGLYCERIN 0.4 MG SL SUBL: 0.4000 mg | SUBLINGUAL_TABLET | SUBLINGUAL | Status: DC | PRN

## 2022-08-30 MED ORDER — ONDANSETRON HCL 4 MG/2ML IJ SOLN: 4.0000 mg | Freq: Four times a day (QID) | INTRAMUSCULAR | Status: DC | PRN

## 2022-08-30 MED ORDER — ASPIRIN 81 MG PO TBEC
81.0000 mg | DELAYED_RELEASE_TABLET | Freq: Every day | ORAL | Status: DC
  Administered 2022-08-31: 81 mg via ORAL
  Filled 2022-08-30: qty 1

## 2022-08-30 MED ORDER — MORPHINE SULFATE (PF) 4 MG/ML IV SOLN: 4.0000 mg | Freq: Once | INTRAVENOUS | Status: DC

## 2022-08-30 MED ORDER — METOPROLOL SUCCINATE ER 25 MG PO TB24
25.0000 mg | ORAL_TABLET | Freq: Every day | ORAL | Status: DC
  Administered 2022-08-30: 25 mg via ORAL
  Filled 2022-08-30 (×2): qty 1

## 2022-08-30 MED ORDER — ACETAMINOPHEN 325 MG PO TABS: 650.0000 mg | ORAL_TABLET | ORAL | Status: DC | PRN

## 2022-08-30 MED ORDER — ASPIRIN 81 MG PO CHEW
324.0000 mg | CHEWABLE_TABLET | ORAL | Status: AC
  Administered 2022-08-30: 324 mg via ORAL
  Filled 2022-08-30: qty 4

## 2022-08-30 MED ORDER — ASPIRIN 300 MG RE SUPP: 300.0000 mg | RECTAL | Status: AC

## 2022-08-30 MED ORDER — PRAVASTATIN SODIUM 40 MG PO TABS: 40.0000 mg | ORAL_TABLET | Freq: Every day | ORAL | Status: DC

## 2022-08-30 NOTE — ED Triage Notes (Signed)
Pt coming from home via EMS with right shoulder pain that began after working out side today. Pt was found pale, diaphoretic, and hypotensive. Pt was seated and soon became normotensive. Pt given 1000cc NS. Pt never complained of cp/shob.

## 2022-08-30 NOTE — ED Notes (Signed)
Patient denies pain and is resting comfortably.  

## 2022-08-30 NOTE — ED Provider Notes (Signed)
John [name]   CSN: [id number] Arrival date & time: 08/30/22  1514     History  Chief Complaint  Patient presents with   Shoulder Pain    Pt coming from home with right should pain after working out in the yard. EMS found pale/diaphoretic, hypotensive and with PAC's.    John [name] is a 88 y.o. male.  The history is provided by the patient, the spouse, the EMS personnel and medical records. No language interpreter was used.     88 year old male significant history of CAD, prior MI, spinal stenosis, brought here via EMS with concerns of chest pain.  Patient is hard of hearing, history was difficult to obtain.  Patient report for the past week he has had intermittent chest discomfort.  He described more as an uncomfortable sensation to his left chest that comes and goes sometimes brought on by exertion.  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  He has to stop and sat outside for bed to feel better.  His wife gave him some sublingual nitro that initially did not help much but now seems to help some with his pain.  EMS was called and reportedly he was hypotensive and he was giving IV fluid.  At this time patient states his symptoms mostly resolved.  He does not endorse any productive cough, nausea, abdominal pain, back pain, focal numbness or focal weakness.  He denies any recent medication changes.  Home Medications Prior to Admission medications   Medication Sig Start Date End Date Taking? Authorizing Provider  amLODipine (NORVASC) 5 MG tablet Take 1 tablet (5 mg total) by mouth daily. 11/10/20   Alver Sorrow, NP  amoxicillin (AMOXIL) 500 MG capsule Take 500 mg by mouth 3 (three) times daily. 09/11/20   [provider]  aspirin 81 MG tablet Take 81 mg by mouth daily.    [provider]  beta  carotene w/minerals (OCUVITE) tablet Take 2 tablets by mouth daily.    [provider]  fluticasone (FLONASE) 50 MCG/ACT nasal spray Place 2 sprays into both nostrils as needed for allergies or rhinitis. 10/29/21 05/29/22  Swinyer, Zachary George, NP  Fluticasone Furoate 50 MCG/ACT AEPB Place 2 sprays into the nose as needed. 10/29/21   Swinyer, Zachary George, NP  metoprolol succinate (TOPROL-XL) 25 MG 24 hr tablet TAKE 1 TABLET (25 MG TOTAL) BY MOUTH DAILY. 12/20/21   Meriam Sprague, MD  nitroGLYCERIN (NITROSTAT) 0.4 MG SL tablet PLACE 1 TABLET (0.4 MG TOTAL) UNDER THE TONGUE EVERY 5 (FIVE) MINUTES AS NEEDED FOR CHEST PAIN. 10/29/21   Swinyer, Zachary George, NP  pravastatin (PRAVACHOL) 80 MG tablet Take 0.5 tablets (40 mg total) by mouth daily. 11/10/20   Alver Sorrow, NP  tamsulosin (FLOMAX) 0.4 MG CAPS capsule Take 0.4 mg by mouth daily.    [provider]  Vitamin D, Ergocalciferol, (DRISDOL) 50000 units CAPS capsule Take 1 capsule by mouth once a week. 02/19/16   [provider]      Allergies    Patient has no known allergies.    Review of Systems   Review of Systems  All other systems reviewed and are negative.   Physical Exam Updated Vital Signs BP (!) 145/69 (BP Location: Right Arm)   Pulse (!) 52   Temp 98.5 F (36.9  C) (Oral)   Resp 16   Ht 5\' 7"  (1.702 m)   Wt 68.9 kg   SpO2 100%   BMI 23.81 kg/m  Physical Exam Vitals and nursing [name] reviewed.  Constitutional:      General: He is not in acute distress.    Appearance: He is well-developed.     Comments: Hard of hearing  HENT:     Head: Atraumatic.  Eyes:     Conjunctiva/sclera: Conjunctivae normal.  Cardiovascular:     Rate and Rhythm: Regular rhythm. Bradycardia present.  Pulmonary:     Effort: Pulmonary effort is normal.     Breath sounds: Normal breath sounds. No wheezing, rhonchi or rales.  Abdominal:     Palpations: Abdomen is soft.     Tenderness: There is no abdominal tenderness.   Musculoskeletal:     Cervical back: Neck supple.     Right lower leg: No edema.     Left lower leg: No edema.  Skin:    Findings: No rash.  Neurological:     Mental Status: He is alert.     ED Results / Procedures / Treatments   Labs (all labs ordered are listed, but only abnormal results are displayed) Labs Reviewed  BASIC METABOLIC PANEL - Abnormal; Notable for the following components:      Result Value   Sodium 132 (*)    BUN 7 (*)    Calcium 8.2 (*)    All other components within normal limits  CBC - Abnormal; Notable for the following components:   RBC 3.88 (*)    Hemoglobin 12.8 (*)    HCT 36.6 (*)    All other components within normal limits  TROPONIN I (HIGH SENSITIVITY)  TROPONIN I (HIGH SENSITIVITY)    EKG EKG Interpretation  Date/Time:  Friday August 30 2022 15:36:40 EDT Ventricular Rate:  46 PR Interval:  187 QRS Duration: 104 QT Interval:  462 QTC Calculation: 405 R Axis:   63 Text Interpretation: Sinus bradycardia Ventricular premature complex Anterior infarct, old Minimal ST depression, lateral leads No significant change was found Confirmed by John [name]) on 08/30/2022 3:39:14 PM  Radiology DG Chest Port 1 View  Result Date: 08/30/2022 CLINICAL DATA:  Chest pain. EXAM: PORTABLE CHEST 1 VIEW COMPARISON:  02/23/2015 FINDINGS: Sternotomy wires are present. Lungs are adequately inflated without focal airspace consolidation or effusion. Cardiomediastinal silhouette and remainder of the exam is unchanged. IMPRESSION: No active disease. Electronically Signed   By: Elberta Fortis M.D.   On: 08/30/2022 15:46    Procedures Procedures    Medications Ordered in ED Medications  morphine (PF) 4 MG/ML injection 4 mg (has no administration in time range)    ED Course/ Medical Decision Making/ A&P                             Medical Decision Making Amount and/or Complexity of Data Reviewed Labs: ordered. Radiology: ordered.   BP (!) 145/69  (BP Location: Right Arm)   Pulse (!) 52   Temp 98.5 F (36.9 C) (Oral)   Resp 16   Ht 5\' 7"  (1.702 m)   Wt 68.9 kg   SpO2 100%   BMI 23.81 kg/m   30:31 PM  88 year old male significant history of CAD, prior MI, spinal stenosis, brought here via EMS with concerns of chest pain.  Patient is hard of hearing, history was difficult to obtain.  Patient report for the  past week he has had intermittent chest discomfort.  He described more as an uncomfortable sensation to his left chest that comes and goes sometimes brought on by exertion.  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  He has to stop and sat outside for bed to feel better.  His wife gave him some sublingual nitro that initially did not help much but now seems to help some with his pain.  EMS was called and reportedly he was hypotensive and he was giving IV fluid.  At this time patient states his symptoms mostly resolved.  He does not endorse any productive cough, nausea, abdominal pain, back pain, focal numbness or focal weakness.  He denies any recent medication changes.  On exam, patient is resting comfortably in bed appears to be in no acute discomfort.  He is hard of hearing.  Heart with bradycardia but no murmur rubs or gallops, lungs are clear to auscultation bilaterally abdomen is soft nontender, he has intact distal pulses throughout, able to move all 4 extremities without difficulty.  He is alert and oriented.  Blood pressure is 159 systolic, HR 52, EKG shows sinus bradycardia  -Labs ordered, independently viewed and interpreted by me.  Labs remarkable for normal troponin, electrolytes are reassuring -The patient was maintained on a cardiac monitor.  I personally viewed and interpreted the cardiac monitored which showed an underlying rhythm of: sinus brady -Imaging independently viewed and interpreted by me and I agree  with radiologist's interpretation.  Result remarkable for CXR without acute changes -This patient presents to the ED for concern of chest pain, this involves an extensive number of treatment options, and is a complaint that carries with it a high risk of complications and morbidity.  The differential diagnosis includes ACS, PE, GERD, MSK, PTX, gastritis -Co morbidities that complicate the patient evaluation includes Hx of MI, HTN -Treatment includes IVF -Reevaluation of the patient after these medicines showed that the patient improved -PCP office notes or outside notes reviewed -Discussion with cardiology team who will see pt and likely admit for further cardiac work up.   -Escalation to admission/observation considered: patient is comfortable with hospital admission  5:57 PM For troponin is normal, patient still endorse some discomfort, morphine was given for pain control.  Appreciate consultation to cardiology team who will evaluate patient and likely will admit for further cardiac workup.         Final Clinical Impression(s) / ED Diagnoses Final diagnoses:  Exertional chest pain  Sinus bradycardia    Rx / DC Orders ED Discharge Orders     None         Fayrene Helper, PA-C 08/30/22 1759    John Octave, MD 08/31/22 [phone]

## 2022-08-30 NOTE — H&P (Signed)
Cardiology Admission History and Physical   Patient ID: John [name] MRN: [id number]; DOB: 1934/06/19   Admission date: 08/30/2022  PCP:  Alysia Penna, MD   [facility] Providers Cardiologist:  Meriam Sprague, MD        Chief Complaint:  Chest and shoulder pain  Patient Profile:   John [name] is a 88 y.o. male with three-vessel CABG in 1999, hypertension, hyperlipidemia who is being seen 08/30/2022 for the evaluation of chest and shoulder pain.  History of Present Illness:   Mr. John [name] is an 88 year old male with the above listed medical history who was last seen in cardiology clinic in February 2024.  At that time he was doing well with a well-controlled blood pressure.  His medications were continued without changes.  Per review of the record, patient has history of 3V-CABG in 1998. In 2015 he underwent nuclear stress test with no evidence of ischemia and hypertensive response to stress that improved with initiation of amlodipine. TTE 11/2021 with LVEF 55 to 60%, no RWMA, grade 2 diastolic dysfunction mildly dilated LA, mild MR, AV structurally tricuspid but functionally bicuspid with fusion of left and noncoronary cusp.  Moderate calcification of aortic valve without any evidence of stenosis.   Today he presents the emergency department due to shoulder and chest pain after working in his yard.  He reported intermittent chest discomfort over the last week that is sometimes related to exertion.  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  He felt diaphoretic, presyncopal, and short of breath.  EMS was called and the patient received IV fluids.  His symptoms resolved after this.  In the emergency department his initial blood pressure was 145/69 with a heart rate of 52.  His laboratories were relatively unremarkable and troponin was 6.  Chest x-ray showed no acute cardiopulmonary disease.  Telemetry  demonstrates sinus rhythm.  Past Medical History:  Diagnosis Date   CAD (coronary artery disease)    MI (myocardial infarction) (HCC) 1978   Spinal stenosis    L4-5   Spondylolisthesis    L4-5    Past Surgical History:  Procedure Laterality Date   CORONARY ARTERY BYPASS GRAFT  1999   3 VESSEL   LUMBAR LAMINECTOMY     with partial facetectomy (L2-3, L3-4, L4-5, L5-S1)     Medications Prior to Admission: Prior to Admission medications   Medication Sig Start Date End Date Taking? Authorizing Provider  amLODipine (NORVASC) 5 MG tablet Take 1 tablet (5 mg total) by mouth daily. 11/10/20   Alver Sorrow, NP  amoxicillin (AMOXIL) 500 MG capsule Take 500 mg by mouth 3 (three) times daily. 09/11/20   [provider]  aspirin 81 MG tablet Take 81 mg by mouth daily.    [provider]  beta carotene w/minerals (OCUVITE) tablet Take 2 tablets by mouth daily.    [provider]  fluticasone (FLONASE) 50 MCG/ACT nasal spray Place 2 sprays into both nostrils as needed for allergies or rhinitis. 10/29/21 05/29/22  Swinyer, Zachary George, NP  Fluticasone Furoate 50 MCG/ACT AEPB Place 2 sprays into the nose as needed. 10/29/21   Swinyer, Zachary George, NP  metoprolol succinate (TOPROL-XL) 25 MG 24 hr tablet TAKE 1 TABLET (25 MG TOTAL) BY MOUTH DAILY. 12/20/21   Meriam Sprague, MD  nitroGLYCERIN (NITROSTAT) 0.4 MG SL tablet PLACE 1 TABLET (0.4 MG TOTAL) UNDER THE TONGUE EVERY 5 (  FIVE) MINUTES AS NEEDED FOR CHEST PAIN. 10/29/21   Swinyer, Zachary George, NP  pravastatin (PRAVACHOL) 80 MG tablet Take 0.5 tablets (40 mg total) by mouth daily. 11/10/20   Alver Sorrow, NP  tamsulosin (FLOMAX) 0.4 MG CAPS capsule Take 0.4 mg by mouth daily.    [provider]  Vitamin D, Ergocalciferol, (DRISDOL) 50000 units CAPS capsule Take 1 capsule by mouth once a week. 02/19/16   [provider]     Allergies:   No Known Allergies  Social History:   Social History    Socioeconomic History   Marital status: Single    Spouse name: Not on file   Number of children: Not on file   Years of education: Not on file   Highest education level: Not on file  Occupational History   Not on file  Tobacco Use   Smoking status: Former    Types: Cigarettes    Quit date: 11/19/1976    Years since quitting: 45.8   Smokeless tobacco: Never  Substance and Sexual Activity   Alcohol use: No   Drug use: No   Sexual activity: Not on file  Other Topics Concern   Not on file  Social History Narrative   Not on file   Social Determinants of Health   Financial Resource Strain: Not on file  Food Insecurity: Not on file  Transportation Needs: Not on file  Physical Activity: Not on file  Stress: Not on file  Social Connections: Not on file  Intimate Partner Violence: Not on file    Family History:   The patient's family history is not on file.    ROS:  Please see the history of present illness.  All other ROS reviewed and negative.     Physical Exam/Data:   Vitals:   08/30/22 1520 08/30/22 1530 08/30/22 1533 08/30/22 1832  BP:  (!) 145/69  (!) 158/81  Pulse:  (!) 52  68  Resp:  16  14  Temp:  98.5 F (36.9 C)    TempSrc:  Oral    SpO2: 98% 100%  99%  Weight:   68.9 kg   Height:    (1.702 m)     Intake/Output Summary (Last 24 hours) at 08/30/2022 1923 Last data filed at 08/30/2022 1520 Gross per 24 hour  Intake 1000 ml  Output --  Net 1000 ml      08/30/2022    3:33 PM 07/03/2022    1:18 PM 01/02/2022    3:28 PM  Last 3 Weights  Weight (lbs) 152 lb 155 lb 9.6 oz 150 lb  Weight (kg) 68.947 kg 70.58 kg 68.04 kg     Body mass index is 23.81 kg/m.  General:  Well nourished, well developed, in no acute distress HEENT: normal Neck: no JVD Vascular: No carotid bruits; Distal pulses 2+ bilaterally   Cardiac:  normal S1, S2; RRR; no murmur  Lungs:  clear to auscultation bilaterally, no wheezing, rhonchi or rales  Abd: soft, nontender, no  hepatomegaly  Ext: no edema Musculoskeletal:  No deformities, BUE and BLE strength normal and equal Skin: warm and dry  Neuro:  CNs 2-12 intact, no focal abnormalities noted Psych:  Normal affect    EKG:  The ECG that was done today was personally reviewed and demonstrates sinus bradycardia with anterior infarction pattern and occasional PVC  Relevant CV Studies:  Echo 11/16/21    1. Left ventricular ejection fraction, by estimation, is 55 to 60%. The  left ventricle has normal function. The left ventricle has no regional  wall motion abnormalities. Left ventricular diastolic parameters are  consistent with Grade II diastolic  dysfunction (pseudonormalization). Elevated left ventricular end-diastolic  pressure.   2. Right ventricular systolic function is normal. The right ventricular  size is normal. Tricuspid regurgitation signal is inadequate for assessing  PA pressure.   3. Left atrial size was mildly dilated.   4. The mitral valve is normal in structure. Mild mitral valve  regurgitation. No evidence of mitral stenosis.   5. The AV appears structurally tricupid but functionally bicuspid with  fusion of the left and non coronary cusps. . The aortic valve is abnormal.  There is moderate calcification of the aortic valve. Aortic valve  regurgitation is not visualized. Aortic  valve sclerosis/calcification is present, without any evidence of aortic  stenosis.   6. The inferior vena cava was not well visualized.      Myoview 02/2014   Normal stress test but significantly elevated BP on exertion (230 mmHg) Amlodipine 2.5 mg daily initiated     2D Echo 02/2013   Left ventricle: The cavity size was normal. Wall thickness  was increased in a pattern of mild LVH. Systolic function  was normal. The estimated ejection fraction was in the range  of 55% to 60%. Wall motion was normal; there were no  regional wall motion abnormalities. Features are consistent  with a pseudonormal  left ventricular filling pattern, with  concomitant abnormal relaxation and increased filling  pressure (grade 2 diastolic dysfunction).   Laboratory Data:  High Sensitivity Troponin:   Recent Labs  Lab 08/30/22 1540 08/30/22 1826  TROPONINIHS 6 6      Chemistry Recent Labs  Lab 08/30/22 1540  NA 132*  K 3.9  CL 99  CO2 23  GLUCOSE 88  BUN 7*  CREATININE 0.69  CALCIUM 8.2*  GFRNONAA >60  ANIONGAP 10    No results for input(s): "PROT", "ALBUMIN", "AST", "ALT", "ALKPHOS", "BILITOT" in the last 168 hours. Lipids No results for input(s): "CHOL", "TRIG", "HDL", "LABVLDL", "LDLCALC", "CHOLHDL" in the last 168 hours. Hematology Recent Labs  Lab 08/30/22 1540  WBC 8.9  RBC 3.88*  HGB 12.8*  HCT 36.6*  MCV 94.3  MCH 33.0  MCHC 35.0  RDW 12.9  PLT 222   Thyroid No results for input(s): "TSH", "FREET4" in the last 168 hours. BNPNo results for input(s): "BNP", "PROBNP" in the last 168 hours.  DDimer No results for input(s): "DDIMER" in the last 168 hours.   Radiology/Studies:  DG Chest Port 1 View  Result Date: 08/30/2022 CLINICAL DATA:  Chest pain. EXAM: PORTABLE CHEST 1 VIEW COMPARISON:  02/23/2015 FINDINGS: Sternotomy wires are present. Lungs are adequately inflated without focal airspace consolidation or effusion. Cardiomediastinal silhouette and remainder of the exam is unchanged. IMPRESSION: No active disease. Electronically Signed   By: Elberta Fortis M.D.   On: 08/30/2022 15:46     Assessment and Plan:   Chest pain: This seems atypical in nature and likely musculoskeletal.  Believe the patient developed a vasovagal reaction with lightheadedness, diaphoresis, and a low blood pressure.  This seems to have resolved.  Will trend troponins.  If troponins are normal and patient is well I think he can be discharged tomorrow.   Coronary artery disease status post three-vessel CABG: The patient has not had coronary angiography in our system since his CABG.  Continue  aspirin 81 mg, Toprol XL 12.5 mg, and pravastatin 40 mg. Hyperlipidemia:  Continue pravastatin for now.  Consider changing to high-dose atorvastatin if troponins are positive.   Risk Assessment/Risk Scores:    TIMI Risk Score for Unstable Angina or Non-ST Elevation MI:   The patient's TIMI risk score is 5, which indicates a 26% risk of all cause mortality, new or recurrent myocardial infarction or need for urgent revascularization in the next 14 days.       Severity of Illness: The appropriate patient status for this patient is INPATIENT. Inpatient status is judged to be reasonable and necessary in order to provide the required intensity of service to ensure the patient's safety. The patient's presenting symptoms, physical exam findings, and initial radiographic and laboratory data in the context of their chronic comorbidities is felt to place them at high risk for further clinical deterioration. Furthermore, it is not anticipated that the patient will be medically stable for discharge from the hospital within 2 midnights of admission.   * I certify that at the point of admission it is my clinical judgment that the patient will require inpatient hospital care spanning beyond 2 midnights from the point of admission due to high intensity of service, high risk for further deterioration and high frequency of surveillance required.*   For questions or updates, please contact [facility] Please consult [email or web address] for contact info under     Signed, Orbie Pyo, MD  08/30/2022 7:23 PM

## 2022-08-31 DIAGNOSIS — R0789 Other chest pain: Secondary | ICD-10-CM

## 2022-08-31 LAB — BASIC METABOLIC PANEL
Anion gap: 6 (ref 5–15)
BUN: 9 mg/dL (ref 8–23)
CO2: 24 mmol/L (ref 22–32)
Calcium: 8.6 mg/dL — ABNORMAL LOW (ref 8.9–10.3)
Chloride: 102 mmol/L (ref 98–111)
Creatinine, Ser: 0.69 mg/dL (ref 0.61–1.24)
GFR, Estimated: >60 mL/min (ref >60)
Glucose, Bld: 119 mg/dL — ABNORMAL HIGH (ref 70–99)
Potassium: 3.6 mmol/L (ref 3.5–5.1)
Sodium: 132 mmol/L — ABNORMAL LOW (ref 135–145)

## 2022-08-31 LAB — TROPONIN I (HIGH SENSITIVITY): Troponin I (High Sensitivity): 29 ng/L — ABNORMAL HIGH (ref <18)

## 2022-08-31 NOTE — Discharge Summary (Addendum)
Discharge Summary    Patient ID: John [name] MRN: [id number]; DOB: [birth year]  Admit date: 08/30/2022 Discharge date: 08/31/2022  PCP:  Alysia Penna, MD   [facility] Providers Cardiologist:  Meriam Sprague, MD        Discharge Diagnoses    Principal Problem:   Atypical chest pain    Diagnostic Studies/Procedures    CXR - results below _____________   History of Present Illness     John [name] is a 88 y.o. male with three-vessel CABG in 1999, hypertension, hyperlipidemia who was admitted 08/30/2022 for the evaluation of chest and shoulder pain.    Hospital Course     Consultants: None  Cardiac enzymes showed a minimal elevation in troponin up to 29.  His chest pain was actually in his right shoulder that had started after working outside.  He was reported as being pale and diaphoretic and hypotensive on admission, but his blood pressure normalized after some IV fluids and he has actually been hypertensive since then.  His chest pain resolved quickly and did not return.  On 4/20, he was evaluated by Dr. Izora Ribas.  He was ambulating without chest pain or shortness of breath and felt back to normal.  In talking with the patient and his wife, he had been doing some strenuous work yesterday around the house. Did not eat or drink very well. He normally walks a lot around the house and going to his shop, no sx. He is encouraged to moderate his activity level.   If he has additional sx, can consider eval for chronotropic incompetence. He is in sinus brady in the 50s, but is asymptomatic w/ this. No change in BB dose.  He had one loose stool, but no ongoing diarrhea, pt thinks it was because he had different food and was anxious. With decreased vision and being very HOH, he was anxious in an unfamiliar environment.  No further inpatient workup was indicated and he is considered stable for discharge, to follow-up as an outpatient.  His neck  scheduled appointment is August 8 with Dr. Shari Prows but we will try to get him an earlier appointment.    Did the patient have an acute coronary syndrome (MI, NSTEMI, STEMI, etc) this admission?:  No                               Did the patient have a percutaneous coronary intervention (stent / angioplasty)?:  No.        _____________  Discharge Vitals Blood pressure 128/72, pulse (!) 56, temperature 98.3 F (36.8 C), resp. rate 20, height 5\' 7"  (1.702 m), weight 68.9 kg, SpO2 100 %.  Filed Weights   08/30/22 1533  Weight: 68.9 kg  General: Well developed, well nourished, male in no acute distress Head: Eyes PERRLA, Head normocephalic and atraumatic Lungs: clear bilaterally to auscultation. Heart: HRRR S1 S2, without rub or gallop. soft murmur. 4/4 extremity pulses are 2+ & equal. No JVD. Abdomen: Bowel sounds are present, abdomen soft and non-tender without masses or  hernias noted. Msk: Normal strength and tone for age. Extremities: No clubbing, cyanosis or edema.    Skin:  No rashes or lesions noted. Neuro: Alert and oriented X 3. Psych:  Good affect, responds appropriately   Labs & Radiologic Studies    CBC Recent Labs    08/30/22 1540  WBC 8.9  HGB 12.8*  HCT 36.6*  MCV  94.3  PLT 222   Basic Metabolic Panel Recent Labs    08/65/78 1540 08/31/22 0123  NA 132* 132*  K 3.9 3.6  CL 99 102  CO2 23 24  GLUCOSE 88 119*  BUN 7* 9  CREATININE 0.69 0.69  CALCIUM 8.2* 8.6*   Liver Function Tests No results for input(s): "AST", "ALT", "ALKPHOS", "BILITOT", "PROT", "ALBUMIN" in the last 72 hours. No results for input(s): "LIPASE", "AMYLASE" in the last 72 hours. High Sensitivity Troponin:   Recent Labs  Lab 08/30/22 1540 08/30/22 1826 08/31/22 0123  TROPONINIHS 6 6 29*    BNP Invalid input(s): "POCBNP" D-Dimer No results for input(s): "DDIMER" in the last 72 hours. Hemoglobin A1C No results for input(s): "HGBA1C" in the last 72 hours. Fasting Lipid  Panel No results for input(s): "CHOL", "HDL", "LDLCALC", "TRIG", "CHOLHDL", "LDLDIRECT" in the last 72 hours. Thyroid Function Tests No results for input(s): "TSH", "T4TOTAL", "T3FREE", "THYROIDAB" in the last 72 hours.  Invalid input(s): "FREET3" _____________  DG Chest Port 1 View  Result Date: 08/30/2022 CLINICAL DATA:  Chest pain. EXAM: PORTABLE CHEST 1 VIEW COMPARISON:  02/23/2015 FINDINGS: Sternotomy wires are present. Lungs are adequately inflated without focal airspace consolidation or effusion. Cardiomediastinal silhouette and remainder of the exam is unchanged. IMPRESSION: No active disease. Electronically Signed   By: Elberta Fortis M.D.   On: 08/30/2022 15:46   Disposition   Pt is being discharged home today in good condition.  Follow-up Plans & Appointments     Discharge Instructions     Diet - low sodium heart healthy   Complete by: As directed    Increase activity slowly   Complete by: As directed         Discharge Medications   Allergies as of 08/31/2022   No Known Allergies      Medication List     TAKE these medications    amLODipine 5 MG tablet Commonly known as: NORVASC Take 1 tablet (5 mg total) by mouth daily.   amoxicillin 500 MG capsule Commonly known as: AMOXIL Take 500 mg by mouth 3 (three) times daily.   aspirin 81 MG tablet Take 81 mg by mouth daily.   beta carotene w/minerals tablet Take 2 tablets by mouth daily.   DermacinRx Ticanase Pak 50-2.7 MCG/ACT-% Thpk Generic drug: Fluticasone-Sodium Chloride Place into the nose.   fluticasone 50 MCG/ACT nasal spray Commonly known as: FLONASE Place 2 sprays into both nostrils as needed for allergies or rhinitis.   Fluticasone Furoate 50 MCG/ACT Aepb Place 2 sprays into the nose as needed.   metoprolol succinate 25 MG 24 hr tablet Commonly known as: TOPROL-XL TAKE 1 TABLET (25 MG TOTAL) BY MOUTH DAILY.   nitroGLYCERIN 0.4 MG SL tablet Commonly known as: Nitrostat PLACE 1 TABLET  (0.4 MG TOTAL) UNDER THE TONGUE EVERY 5 (FIVE) MINUTES AS NEEDED FOR CHEST PAIN.   pravastatin 80 MG tablet Commonly known as: PRAVACHOL Take 0.5 tablets (40 mg total) by mouth daily.   tamsulosin 0.4 MG Caps capsule Commonly known as: FLOMAX Take 0.4 mg by mouth daily.   Vitamin D (Ergocalciferol) 1.25 MG (50000 UNIT) Caps capsule Commonly known as: DRISDOL Take 1 capsule by mouth once a week.           Outstanding Labs/Studies   None  Duration of Discharge Encounter   Greater than 30 minutes including physician time.  Signed, Theodore Demark, PA-C 08/31/2022, 10:38 AM   Personally seen and examined. Agree with APP above with  the following comments:  Briefly 88 yo M with history of CABG who presented with atypical chest pain.  Took two nitroglycerin. Admitted by Dr. Lynnette Caffey for OBS overnight.  Patient notes no further episodes.  No CP, SOB, palpitations or syncope. Had mild HA when he took the PRN nitro. Had one loose stool  Exam notable for  Hard of hearing patient; regular bradycardia CTAB.    Labs notable for  Hs troponin 6-> 6-> 29.  No consistent with NSTEMI. Tele: VTACH is artifactual. Sinus bradycardia in 50s on low dose metoprolol.  Asymptomatic of bradycardia. Rare PVCs.  Would recommend  - will plan for discharge with Imdur 15 mg PO Daily - if he has HA that does not improve within the week, can stop and work on BP control instead. - no other changes to medication. - needs f/u with Dr. Shari Prows or her team. - discussed with family who are amenable to plan.  Riley Lam, MD FASE [facility] Cardiologist [facility]  [address] [place], [address] [place], [place] [address] [phone]  10:57 AM

## 2022-08-31 NOTE — ED Notes (Signed)
Patient ambulated to bathroom with stand by assist 

## 2022-09-01 LAB — LIPOPROTEIN A (LPA): Lipoprotein (a): 297 nmol/L — ABNORMAL HIGH (ref <75.0)

## 2022-09-05 NOTE — Progress Notes (Addendum)
Cardiology Office Note:    Date:  09/06/2022  ID:  John [name], DOB [birth year], MRN [id number] PCP: Alysia Penna, MD  [facility] Providers Cardiologist:  Meriam Sprague, MD       Patient Profile:   Coronary artery disease S/p CABG in 1998 Myoview 02/25/2014: No scar or ischemia, EF 57, low risk TTE 11/16/2021: EF 55-60, no RWMA, GR 2 DD, normal RVSF, mild LAE, mild MR, functionally bicuspid aortic valve, AV sclerosis Hypertension  Hyperlipidemia      History of Present Illness:   John [name] is a 88 y.o. male who returns for post hospitalization follow up. He was last seen by Dr. Shari Prows 07/03/22. He was admitted 4/19-4/20 with chest pain and shoulder pain.  Initial troponins were negative.  Subsequent troponin elevated to 29.  This was not felt to be consistent with ACS.  He was brought in by EMS.  He had taken nitroglycerin and was hypotensive.  He was treated with IV fluids.  He was noted to be bradycardic, but asymptomatic.  No further inpatient workup was warranted.  He was placed on isosorbide 15 mg daily. He is here today with his wife.  His left arm discomfort has resolved.  He still does have some chest tightness.  He also has some head congestion.  He wonders if it is related.  He does get short of breath with some activities.  He does note fatigue with activities as well.  He has not had syncope.  It sounds as though he had near syncope prior to going to the hospital.  He has not had orthopnea, leg edema.  Review of Systems  HENT:  Positive for congestion.    see the HPI    Studies Reviewed:    EKG: Sinus bradycardia HR 59, normal axis, PACs, QTc 401   Risk Assessment/Calculations:     HYPERTENSION CONTROL Vitals:   09/06/22 0749 09/06/22 0842  BP: (!) 141/60 (!) 150/70    The patient's blood pressure is elevated above target today.  In order to address the patient's elevated BP: Blood pressure will be monitored at home to determine if  medication changes need to be made.; A new medication was prescribed today.      STOP-Bang Score:  5      Physical Exam:   VS:  BP (!) 150/70   Pulse (!) 59   Ht 5\' 7"  (1.702 m)   Wt 156 lb 3.2 oz (70.9 kg)   SpO2 95%   BMI 24.46 kg/m    Wt Readings from Last 3 Encounters:  09/06/22 156 lb 3.2 oz (70.9 kg)  08/30/22 152 lb (68.9 kg)  07/03/22 155 lb 9.6 oz (70.6 kg)    Constitutional:      Appearance: Healthy appearance. Not in distress.  Neck:     Vascular: JVD normal.  Pulmonary:     Breath sounds: Normal breath sounds. No wheezing. No rales.  Cardiovascular:     Normal rate. Regular rhythm.     Murmurs: There is no murmur.  Edema:    Peripheral edema absent.  Abdominal:     Palpations: Abdomen is soft.  Skin:    General: Skin is warm and dry.       ASSESSMENT AND PLAN:   CAD (coronary artery disease) History of CABG in 1998.  Myoview in 2015 was low risk.  Echo in July 2023 with normal EF.  He was recently hospitalized with chest and left arm pain.  His initial troponins were negative.  His last troponin was 29.  He has continued to have some chest tightness.  However, this seems to be related to head congestion as well.  He does note shortness of breath and fatigue.  He is bradycardic.  Question if symptoms may be related to bradycardia versus ischemia.  He never received a prescription for isosorbide.  He does not take PDE-5 inhibitors. Start isosorbide mononitrate 15 mg daily. Arrange Lexiscan Myoview Continue ASA 81 mg daily, amlodipine 5 mg daily, pravastatin 40 mg daily. Follow-up in August or sooner if symptoms persist/worsen or testing abnormal  Near syncope He had an episode of what sounds like near syncope when he presented to the hospital. It may have all been related to low BP from NTG. But his HR is in the 50s and he notes decreased exercise tolerance.  DC Metoprolol succinate Lexiscan Myoview 14 day Zio XT F/u in Aug 2024 or sooner if symptoms  persist/worsen or testing abnormal.  Hyperlipidemia Continue pravastatin 40 mg daily.  Essential hypertension Blood pressure elevated today.  His wife notes that his blood pressures are checked at home and are always optimal.  He likely has a component of whitecoat hypertension.  Discontinue metoprolol as noted for bradycardia.  Start isosorbide mononitrate 15 mg daily as noted.  Continue to monitor blood pressures.  I have asked him to contact us if his blood pressure runs 140/90 or higher.      Informed Consent   Shared Decision Making/Informed Consent The risks [chest pain, shortness of breath, cardiac arrhythmias, dizziness, blood pressure fluctuations, myocardial infarction, stroke/transient ischemic attack, nausea, vomiting, allergic reaction, radiation exposure, metallic taste sensation and life-threatening complications (estimated to be 1 in 10,000)], benefits (risk stratification, diagnosing coronary artery disease, treatment guidance) and alternatives of a nuclear stress test were discussed in detail with [name] and he agrees to proceed.     Dispo:  Return in about 3 months (around 12/19/2022) for Scheduled Follow Up, w/ Dr. Shari Prows.  Signed, Tereso Newcomer, PA-C

## 2022-09-06 ENCOUNTER — Ambulatory Visit (INDEPENDENT_AMBULATORY_CARE_PROVIDER_SITE_OTHER): Payer: Medicare Other

## 2022-09-06 ENCOUNTER — Encounter: Payer: Self-pay | Admitting: Physician Assistant

## 2022-09-06 ENCOUNTER — Other Ambulatory Visit: Payer: Self-pay | Admitting: Physician Assistant

## 2022-09-06 ENCOUNTER — Ambulatory Visit: Payer: Medicare Other | Attending: Physician Assistant | Admitting: Physician Assistant

## 2022-09-06 VITALS — BP 150/70 | HR 59 | Ht 67.0 in | Wt 156.2 lb

## 2022-09-06 DIAGNOSIS — R55 Syncope and collapse: Secondary | ICD-10-CM

## 2022-09-06 DIAGNOSIS — E782 Mixed hyperlipidemia: Secondary | ICD-10-CM

## 2022-09-06 DIAGNOSIS — I25119 Atherosclerotic heart disease of native coronary artery with unspecified angina pectoris: Secondary | ICD-10-CM

## 2022-09-06 DIAGNOSIS — R001 Bradycardia, unspecified: Secondary | ICD-10-CM | POA: Diagnosis not present

## 2022-09-06 DIAGNOSIS — R079 Chest pain, unspecified: Secondary | ICD-10-CM

## 2022-09-06 DIAGNOSIS — I2510 Atherosclerotic heart disease of native coronary artery without angina pectoris: Secondary | ICD-10-CM

## 2022-09-06 DIAGNOSIS — I10 Essential (primary) hypertension: Secondary | ICD-10-CM

## 2022-09-06 MED ORDER — METOPROLOL SUCCINATE ER 25 MG PO TB24: ORAL_TABLET | ORAL | 0 refills | Status: DC

## 2022-09-06 MED ORDER — ISOSORBIDE MONONITRATE ER 30 MG PO TB24: 15.0000 mg | ORAL_TABLET | Freq: Every day | ORAL | 3 refills | Status: DC

## 2022-09-06 NOTE — Progress Notes (Unsigned)
ZIO XT [id number] from office inventory applied to patient.  Dr. Shari Prows to read.

## 2022-09-06 NOTE — Assessment & Plan Note (Signed)
He had an episode of what sounds like near syncope when he presented to the hospital. It may have all been related to low BP from NTG. But his HR is in the 50s and he notes decreased exercise tolerance.  DC Metoprolol succinate Lexiscan Myoview 14 day Zio XT F/u in Aug 2024 or sooner if symptoms persist/worsen or testing abnormal.

## 2022-09-06 NOTE — Patient Instructions (Addendum)
Medication Instructions:  Your physician has recommended you make the following change in your medication: 1.  REDUCE the  Metoprolol to 1/2 tablet for 3 days then stop it 2.  START Isosorbide (Imdur) 30 mg taking only 1/2 tablet daily   *If you need a refill on your cardiac medications before your next appointment, please call your pharmacy*   Lab Work: None ordered  If you have labs (blood work) drawn today and your tests are completely normal, you will receive your results only by: MyChart Message (if you have MyChart) OR A paper copy in the mail If you have any lab test that is abnormal or we need to change your treatment, we will call you to review the results.   Testing/Procedures: Your physician has requested that you have a lexiscan myoview. For further information please visit [email or web address] Please follow instruction sheet, BELOW:    You are scheduled for a Myocardial Perfusion Imaging Study. Please arrive 15 minutes prior to your appointment time for registration and insurance purposes.  The test will take approximately 3 to 4 hours to complete; you may bring reading material.  If someone comes with you to your appointment, they will need to remain in the main lobby due to limited space in the testing area. **If you are pregnant or breastfeeding, please notify the nuclear lab prior to your appointment**  How to prepare for your Myocardial Perfusion Test: Do not eat or drink 3 hours prior to your test, except you may have water. Do not consume products containing caffeine (regular or decaffeinated) 12 hours prior to your test. (ex: coffee, chocolate, sodas, tea). Do bring a list of your current medications with you.  If not listed below, you may take your medications as normal. Do wear comfortable clothes (no dresses or overalls) and walking shoes, tennis shoes preferred (No heels or open toe shoes are allowed). Do NOT wear cologne, perfume, aftershave, or lotions  (deodorant is allowed). If these instructions are not followed, your test will have to be rescheduled.   ZIO XT- Long Term Monitor Instructions  Your physician has requested you wear a ZIO patch monitor for 14 days.  This is a single patch monitor. [employer] supplies one patch monitor per enrollment. Additional stickers are not available. Please do not apply patch if you will be having a Nuclear Stress Test,  Echocardiogram, Cardiac CT, MRI, or Chest Xray during the period you would be wearing the  monitor. The patch cannot be worn during these tests. You cannot remove and re-apply the  ZIO XT patch monitor.  Your ZIO patch monitor will be mailed 3 day USPS to your address on file. It may take 3-5 days  to receive your monitor after you have been enrolled.  Once you have received your monitor, please review the enclosed instructions. Your monitor  has already been registered assigning a specific monitor serial # to you.  Billing and Patient Assistance Program Information  We have supplied [employer] with any of your insurance information on file for billing purposes. [employer] offers a sliding scale Patient Assistance Program for patients that do not have  insurance, or whose insurance does not completely cover the cost of the ZIO monitor.  You must apply for the Patient Assistance Program to qualify for this discounted rate.  To apply, please call [employer] at [phone], select option 4, select option 2, ask to apply for  Patient Assistance Program. Meredeth Ide will ask your household income, and how many people  are in  your household. They will quote your out-of-pocket cost based on that information.  [employer] will also be able to set up a 57-month, interest-free payment plan if needed.  Applying the monitor   Shave hair from upper left chest.  Hold abrader disc by orange tab. Rub abrader in 40 strokes over the upper left chest as  indicated in your monitor instructions.  Clean area with 4  enclosed alcohol pads. Let dry.  Apply patch as indicated in monitor instructions. Patch will be placed under collarbone on left  side of chest with arrow pointing upward.  Rub patch adhesive wings for 2 minutes. Remove white label marked "1". Remove the white  label marked "2". Rub patch adhesive wings for 2 additional minutes.  While looking in a mirror, press and release button in center of patch. A small green light will  flash 3-4 times. This will be your only indicator that the monitor has been turned on.  Do not shower for the first 24 hours. You may shower after the first 24 hours.  Press the button if you feel a symptom. You will hear a small click. Record Date, Time and  Symptom in the Patient Logbook.  When you are ready to remove the patch, follow instructions on the last 2 pages of Patient  Logbook. Stick patch monitor onto the last page of Patient Logbook.  Place Patient Logbook in the blue and white box. Use locking tab on box and tape box closed  securely. The blue and white box has prepaid postage on it. Please place it in the mailbox as  soon as possible. Your physician should have your test results approximately 7 days after the  monitor has been mailed back to [facility].  Call [facility] Customer Care at [phone] if you have questions regarding  your ZIO XT patch monitor. Call them immediately if you see an orange light blinking on your  monitor.  If your monitor falls off in less than 4 days, contact our Monitor department at [phone].  If your monitor becomes loose or falls off after 4 days call [employer] at [phone] for  suggestions on securing your monitor   Follow-Up: At [facility] you and your health needs are our priority.  As part of our continuing mission to provide you with exceptional heart care, we have created designated Provider Care Teams.  These Care Teams include your primary Cardiologist (physician) and Advanced Practice  Providers (APPs -  Physician Assistants and Nurse Practitioners) who all work together to provide you with the care you need, when you need it.  We recommend signing up for the patient portal called "MyChart".  Sign up information is provided on this After Visit Summary.  MyChart is used to connect with patients for Virtual Visits (Telemedicine).  Patients are able to view lab/test results, encounter notes, upcoming appointments, etc.  Non-urgent messages can be sent to your provider as well.   To learn more about what you can do with MyChart, go to [email or web address].    Your next appointment:   As scheduled  Provider:   Meriam Sprague, MD     Other Instructions IF YOUR SYMPTOMS WORSEN OR DO NOT GET ANY BETTER, PLEASE CALL AND LET us KNOW   Your physician has requested that you regularly monitor and record your blood pressure readings at home. Please use the same machine at the same time of day to check your readings and record them to bring to your follow-up visit.  Please monitor blood pressures and keep a log of your readings. If your blood pressure gets to 140/90 or higher X's 3, then call us.   Make sure to check 2 hours after your medications.    AVOID these things for 30 minutes before checking your blood pressure: No Drinking caffeine. No Drinking alcohol. No Eating. No Smoking. No Exercising.   Five minutes before checking your blood pressure: Pee. Sit in a dining chair. Avoid sitting in a soft couch or armchair. Be quiet. Do not talk

## 2022-09-06 NOTE — Assessment & Plan Note (Signed)
Blood pressure elevated today.  His wife notes that his blood pressures are checked at home and are always optimal.  He likely has a component of whitecoat hypertension.  Discontinue metoprolol as noted for bradycardia.  Start isosorbide mononitrate 15 mg daily as noted.  Continue to monitor blood pressures.  I have asked him to contact us if his blood pressure runs 140/90 or higher.

## 2022-09-06 NOTE — Assessment & Plan Note (Signed)
Continue pravastatin 40 mg daily. 

## 2022-09-06 NOTE — Assessment & Plan Note (Signed)
History of CABG in 1998.  Myoview in 2015 was low risk.  Echo in July 2023 with normal EF.  He was recently hospitalized with chest and left arm pain.  His initial troponins were negative.  His last troponin was 29.  He has continued to have some chest tightness.  However, this seems to be related to head congestion as well.  He does note shortness of breath and fatigue.  He is bradycardic.  Question if symptoms may be related to bradycardia versus ischemia.  He never received a prescription for isosorbide.  He does not take PDE-5 inhibitors. Start isosorbide mononitrate 15 mg daily. Arrange Lexiscan Myoview Continue ASA 81 mg daily, amlodipine 5 mg daily, pravastatin 40 mg daily. Follow-up in August or sooner if symptoms persist/worsen or testing abnormal

## 2022-09-10 ENCOUNTER — Telehealth (HOSPITAL_COMMUNITY): Payer: Self-pay | Admitting: *Deleted

## 2022-09-10 NOTE — Telephone Encounter (Signed)
Pt reached and given instructions for MPI study scheduled on 09/12/22.

## 2022-09-11 NOTE — Addendum Note (Signed)
Addended byAlben Spittle, [name] T on: 09/11/2022 09:00 AM   Modules accepted: Orders

## 2022-09-12 ENCOUNTER — Ambulatory Visit (HOSPITAL_COMMUNITY): Payer: Medicare Other | Attending: Physician Assistant

## 2022-09-12 DIAGNOSIS — R079 Chest pain, unspecified: Secondary | ICD-10-CM | POA: Diagnosis present

## 2022-09-12 LAB — MYOCARDIAL PERFUSION IMAGING
LV dias vol: 83 mL (ref 62–150)
LV sys vol: 35 mL
Nuc Stress EF: 58 %
Peak HR: 88 {beats}/min
Rest HR: 60 {beats}/min
Rest Nuclear Isotope Dose: 10.4 mCi
SDS: 1
SRS: 0
SSS: 1
ST Depression (mm): 0 mm
Stress Nuclear Isotope Dose: 32.3 mCi
TID: 0.94

## 2022-09-12 MED ORDER — REGADENOSON 0.4 MG/5ML IV SOLN
0.4000 mg | Freq: Once | INTRAVENOUS | Status: AC
Start: 2022-09-12 — End: 2022-09-12
  Administered 2022-09-12: 0.4 mg via INTRAVENOUS

## 2022-09-12 MED ORDER — TECHNETIUM TC 99M TETROFOSMIN IV KIT
10.4000 | PACK | Freq: Once | INTRAVENOUS | Status: AC | PRN
  Administered 2022-09-12: 10.4 via INTRAVENOUS

## 2022-09-12 MED ORDER — TECHNETIUM TC 99M TETROFOSMIN IV KIT
32.3000 | PACK | Freq: Once | INTRAVENOUS | Status: AC | PRN
  Administered 2022-09-12: 32.3 via INTRAVENOUS

## 2022-09-13 ENCOUNTER — Encounter: Payer: Self-pay | Admitting: Physician Assistant

## 2022-09-16 NOTE — Progress Notes (Signed)
Pt wife, [place], [place] on file, has been made aware of normal result and verbalized understanding.  jw

## 2022-10-06 ENCOUNTER — Encounter: Payer: Self-pay | Admitting: Physician Assistant

## 2022-10-06 DIAGNOSIS — I491 Atrial premature depolarization: Secondary | ICD-10-CM

## 2022-10-06 HISTORY — DX: Atrial premature depolarization: I49.1

## 2022-10-08 ENCOUNTER — Telehealth: Payer: Self-pay

## 2022-10-08 MED ORDER — METOPROLOL TARTRATE 25 MG PO TABS: 12.5000 mg | ORAL_TABLET | ORAL | 3 refills | Status: DC | PRN

## 2022-10-08 NOTE — Telephone Encounter (Signed)
-----   Message from Beatrice Lecher, [place] sent at 10/06/2022 11:54 AM EDT ----- Monitor showed normal sinus rhythm, sinus bradycardia; 57 brief runs of SVT (longest 19 beats), frequent PACs (9.7%) and rare PVCs (< 1%). I stopped his Metoprolol succinate recently due to low HRs. I do not think we should have him on a beta blocker every day. But I will start him on Metoprolol tartrate 12.5 mg to take only once daily as needed for palpitations. PLAN:  -Start Metoprolol tartrate 12.5 mg once daily as needed for significant palpitations. -F/u as planned Tereso Newcomer, PA-C    10/06/2022 11:48 AM

## 2022-10-08 NOTE — Telephone Encounter (Signed)
The patient's wife has been notified of the result and verbalized understanding.  All questions (if any) were answered. Frutoso Schatz, RN 10/08/2022 9:08 AM  Prescription has been sent in.

## 2022-11-13 ENCOUNTER — Telehealth: Payer: Self-pay | Admitting: Physician Assistant

## 2022-11-13 NOTE — Telephone Encounter (Signed)
Left message for patient with instructions from Tereso Newcomer, PA-C:  Ok to stop Imdur. Monitor blood pressure until next OV. Call if BP consistently >139/89. Tereso Newcomer, PA-C    11/13/2022 1:00 PM      Provided office number for callback if any questions.

## 2022-11-13 NOTE — Telephone Encounter (Signed)
Received incoming call from patient's wife Snowdene (OK per DPR).  Discussed recommendation from [facility] (see previous note). Snowdene verbalized understanding.  She requested to schedule F/U appt with Tereso Newcomer, PA-C for patient, accepted appt on 01/15/23.  Snowdene expressed appreciation for call.

## 2022-11-13 NOTE — Telephone Encounter (Signed)
Spoke with pt and pt's wife, DPR.  Pt complains of fatigue and his entire body feeling heavy after taking Isosorbide 15mg  in the mornings.  Pt denies current CP, SOB, dizziness or edema.  BP this morning 2 hours after medications is 118/50 with HR-67.   Advised will forward information to Tereso Newcomer, PA-C for review and further recommendation.  Pt verbalizes understanding and agrees with current plan.

## 2022-11-13 NOTE — Telephone Encounter (Signed)
Ok to stop Imdur. Monitor blood pressure until next OV. Call if BP consistently >139/89. Tereso Newcomer, PA-C    11/13/2022 1:00 PM

## 2022-11-13 NOTE — Telephone Encounter (Signed)
Pt c/o medication issue:  1. Name of Medication: Isosorbide 30 mg  2. How are you currently taking this medication (dosage and times per day)?   3. Are you having a reaction (difficulty breathing--STAT)?   4. What is your medication issue? Wife says taking this medicine  does not make him feel any better- felt awful yesterday

## 2022-11-15 ENCOUNTER — Telehealth: Payer: Self-pay | Admitting: Cardiology

## 2022-11-15 NOTE — Telephone Encounter (Signed)
Returned call to wife [name] and advised of [name]'s recommendations. She states that now an hour after taking the extra 1/2 tablet (2.5 mg) his BP is still 181/77. Informed her that [name] reviewed the BP log she reported and feels Amlodipine 5mg  BID is the best step forward. Advised her to go ahead and give him the remaining 1/2 tablet from an hour ago, then a whole tablet tonight at bedtime, then starting tomorrow, she will give 1 in the AM and 1 in the PM. Verbal readback of instructions was correct and she will call Monday if BP not back to baseline/improved.

## 2022-11-15 NOTE — Telephone Encounter (Signed)
Pt's wife calling to notify the doctor that his blood pressure keeps increasing gradually, has dizziness, and shaky pt would like a callback from the nurse. Please advise.

## 2022-11-15 NOTE — Telephone Encounter (Signed)
Chart indicates he takes Amlodipine 5 mg once daily. PLAN: -Increase Amlodipine to 5 mg twice daily  Tereso Newcomer, PA-C    11/15/2022 1:42 PM

## 2022-11-15 NOTE — Telephone Encounter (Signed)
Returned call to wife John [name] on [place] who provides following BP log:  7/3  135/61(58)        118/50 (67) *ate seafood for supper  7/4  159/77 (63)@0845         171/78 (60)@0915         151/67 (62)@1100         141/59(64)@2200 *ate steak&potatoes +liquid IV (500mg  sodium) *began taking BP meds at night this night, so none as of AM 7/3, until now. Also stopped Imdur(encounter in chart) 7/5  148/92@0900         159/79@1015         180/87(60) @12   *drank another liquid IV between these readings, no breakfast        177/85 (55) at time of call  John Newcomer, PA stopped Toprol, changed pt to Imdur 15mg , but pt couldn't tolerate/"felt awful." Per OV note on 09/06/22: Essential hypertension Blood pressure elevated today.  His wife notes that his blood pressures are checked at home and are always optimal.  He likely has a component of whitecoat hypertension.  Discontinue metoprolol as noted for bradycardia.  Start isosorbide mononitrate 15 mg daily as noted.  Continue to monitor blood pressures.  I have asked him to contact us if his blood pressure runs 140/90 or higher.   Advised wife to hold any excess sodium at this point, no more liquid IV's. She verified he's not been working outside a lot, so this could be pushing his pressure up, as well as, recent meals high in sodium and moved medications to PM rather than AM d/t bradycardia symptoms. Also advised NOT to give PRN Metoprolol as she requested, but rather to take an extra 1/2 (2.5mg  total) Amlodipine tablet at this time and repeat BP check in 1 hour. If continues to trend up, she should go to [facility] or ED. Routing to [employer].

## 2022-12-17 ENCOUNTER — Other Ambulatory Visit: Payer: Self-pay | Admitting: Nurse Practitioner

## 2022-12-17 NOTE — Telephone Encounter (Signed)
Recommend refill by PCP.

## 2022-12-17 NOTE — Telephone Encounter (Signed)
Pt's pharmacy is requesting a refill on fluticasone nasal spray. Would Almira Bar, NP like to refill this non cardiac medication? Please address

## 2022-12-19 ENCOUNTER — Ambulatory Visit: Payer: Medicare Other | Admitting: Cardiology

## 2023-01-10 ENCOUNTER — Other Ambulatory Visit: Payer: Self-pay | Admitting: Nurse Practitioner

## 2023-01-10 DIAGNOSIS — I25810 Atherosclerosis of coronary artery bypass graft(s) without angina pectoris: Secondary | ICD-10-CM

## 2023-01-15 ENCOUNTER — Ambulatory Visit: Payer: Medicare Other | Attending: Cardiology | Admitting: Physician Assistant

## 2023-01-15 ENCOUNTER — Encounter: Payer: Self-pay | Admitting: Physician Assistant

## 2023-01-15 VITALS — BP 160/60 | HR 62 | Ht 68.0 in | Wt 150.6 lb

## 2023-01-15 DIAGNOSIS — I2510 Atherosclerotic heart disease of native coronary artery without angina pectoris: Secondary | ICD-10-CM

## 2023-01-15 DIAGNOSIS — E7800 Pure hypercholesterolemia, unspecified: Secondary | ICD-10-CM | POA: Diagnosis not present

## 2023-01-15 DIAGNOSIS — I471 Supraventricular tachycardia, unspecified: Secondary | ICD-10-CM | POA: Diagnosis not present

## 2023-01-15 DIAGNOSIS — I10 Essential (primary) hypertension: Secondary | ICD-10-CM | POA: Diagnosis not present

## 2023-01-15 HISTORY — DX: Supraventricular tachycardia, unspecified: I47.10

## 2023-01-15 NOTE — Progress Notes (Signed)
Cardiology Office Note:    Date:  01/15/2023  ID:  John [name], DOB [birth year], MRN [id number] PCP: Alysia Penna, MD  [facility] Providers Cardiologist:  Meriam Sprague, MD (Inactive)       Patient Profile:      Coronary artery disease S/p CABG in 1998 Myoview 02/25/2014: No scar or ischemia, EF 57, low risk TTE 11/16/2021: EF 55-60, no RWMA, GR 2 DD, normal RVSF, mild LAE, mild MR, functionally bicuspid aortic valve, AV sclerosis Myoview 09/12/22: No ischemia or infarction, EF 58, low risk Hypertension  Hyperlipidemia  Bradycardia  Supraventricular Tachycardia Monitor 09/2022: NSR, average heart rate 65; 57 runs of SVT (longest 19 beats); frequent PACs (9.7%), rare PVCs        History of Present Illness:  Discussed the use of AI scribe software for clinical note transcription with the patient, who gave verbal consent to proceed.    John [name] is a 88 y.o. male who returns for follow up of CAD, Supraventricular Tachycardia, bradycardia. He was last seen in April 2024, following a hospital admission for chest and shoulder pain and hypotension. During the hospital admission, his troponin levels were minimally elevated but not consistent with acute coronary syndrome. He was treated with IV fluids for hypotension and started on isosorbide for chest discomfort. The patient's metoprolol succinate was also stopped due to episodes of near syncope and bradycardia.  I arranged an outpatient stress test which showed no ischemia.  I also obtained an event monitor which showed sinus rhythm with short runs of SVT and premature atrial contractions (PACs). The patient was prescribed metoprolol tartrate 12.5 mg to be taken as needed for palpitations. The patient reports no recent episodes of fast or irregular heartbeats and has not needed to use the as-needed metoprolol. He denies any recent chest pain or significant shortness of breath.  He had side effects on isosorbide and this was  stopped.  He remains active, engaging in activities such as walking and occasional use of a chainsaw. He has not experienced any episodes of syncope or near syncope since the last visit. His home blood pressure readings have been optimal.  He has a component of whitecoat hypertension.    ROS:  See HPI    Studies Reviewed:        Risk Assessment/Calculations:     HYPERTENSION CONTROL Vitals:   01/15/23 1528 01/15/23 1630  BP: (!) 152/50 (!) 160/60    The patient's blood pressure is elevated above target today.  In order to address the patient's elevated BP: The blood pressure is usually elevated in clinic.  Blood pressures monitored at home have been optimal.      STOP-Bang Score:         Physical Exam:   VS:  BP (!) 160/60   Pulse 62   Ht 5\' 8"  (1.727 m)   Wt 150 lb 9.6 oz (68.3 kg)   SpO2 94%   BMI 22.90 kg/m    Wt Readings from Last 3 Encounters:  01/15/23 150 lb 9.6 oz (68.3 kg)  09/12/22 156 lb (70.8 kg)  09/06/22 156 lb 3.2 oz (70.9 kg)    Constitutional:      Appearance: Healthy appearance. Not in distress.  Pulmonary:     Breath sounds: Normal breath sounds. No wheezing. No rales.  Cardiovascular:     Normal rate. Regular rhythm.     Murmurs: There is a grade 2/6 systolic murmur at the URSB.  Edema:  Peripheral edema absent.  Skin:    General: Skin is warm and dry.        Assessment and Plan:     Coronary Artery Disease (CAD) Status post coronary artery bypass graft in 1998. Recent stress test in May 2024 was low risk without ischemia. No current symptoms suggestive of angina. -Continue Aspirin 81mg  daily. -Continue Pravastatin 40mg  daily. -Follow up 1 year  Hypertension Patient has whitecoat hypertension.  Blood pressures at home are optimal.   -Continue Amlodipine 5mg  daily. -Continue home blood pressure monitoring.  Supraventricular Tachycardia (SVT) Recent monitor showed several short runs of SVT. Metoprolol tartrate prescribed as needed for  palpitations, but has not been needed. -Continue Metoprolol tartrate 12.5mg  daily as needed for palpitations.  Hyperlipidemia Most recent LDL 97, above target. Given advanced age, continue current therapy. -Continue Pravastatin 40mg  daily.        Dispo:  Return in about 1 year (around 01/15/2024) for Routine Follow Up w/ Dr. Anne Fu, or Tereso Newcomer, PA-C.  Signed, Tereso Newcomer, PA-C

## 2023-01-15 NOTE — Patient Instructions (Signed)
Medication Instructions:  Your physician recommends that you continue on your current medications as directed. Please refer to the Current Medication list given to you today.  *If you need a refill on your cardiac medications before your next appointment, please call your pharmacy*   Lab Work: None ordered  If you have labs (blood work) drawn today and your tests are completely normal, you will receive your results only by: MyChart Message (if you have MyChart) OR A paper copy in the mail If you have any lab test that is abnormal or we need to change your treatment, we will call you to review the results.   Testing/Procedures: None ordered   Follow-Up: At [facility], you and your health needs are our priority.  As part of our continuing mission to provide you with exceptional heart care, we have created designated Provider Care Teams.  These Care Teams include your primary Cardiologist (physician) and Advanced Practice Providers (APPs -  Physician Assistants and Nurse Practitioners) who all work together to provide you with the care you need, when you need it.  We recommend signing up for the patient portal called "MyChart".  Sign up information is provided on this After Visit Summary.  MyChart is used to connect with patients for Virtual Visits (Telemedicine).  Patients are able to view lab/test results, encounter notes, upcoming appointments, etc.  Non-urgent messages can be sent to your provider as well.   To learn more about what you can do with MyChart, go to [email or web address].    Your next appointment:   1 year(s)  Provider:   Meriam Sprague, MD (Inactive)  or Tereso Newcomer, PA-C         Other Instructions

## 2023-03-20 ENCOUNTER — Emergency Department (HOSPITAL_COMMUNITY): Payer: Medicare Other

## 2023-03-20 ENCOUNTER — Other Ambulatory Visit: Payer: Self-pay

## 2023-03-20 ENCOUNTER — Encounter (HOSPITAL_COMMUNITY): Payer: Self-pay

## 2023-03-20 ENCOUNTER — Inpatient Hospital Stay (HOSPITAL_COMMUNITY)
Admission: EM | Admit: 2023-03-20 | Discharge: 2023-03-23 | DRG: 086 | Disposition: A | Discharge status: Home Health | Payer: Medicare Other | Attending: General Surgery | Admitting: General Surgery

## 2023-03-20 DIAGNOSIS — S0101XA Laceration without foreign body of scalp, initial encounter: Secondary | ICD-10-CM | POA: Diagnosis present

## 2023-03-20 DIAGNOSIS — S066X0A Traumatic subarachnoid hemorrhage without loss of consciousness, initial encounter: Secondary | ICD-10-CM | POA: Diagnosis present

## 2023-03-20 DIAGNOSIS — S81011A Laceration without foreign body, right knee, initial encounter: Secondary | ICD-10-CM | POA: Diagnosis present

## 2023-03-20 DIAGNOSIS — S065X0A Traumatic subdural hemorrhage without loss of consciousness, initial encounter: Secondary | ICD-10-CM | POA: Diagnosis present

## 2023-03-20 DIAGNOSIS — S40022A Contusion of left upper arm, initial encounter: Secondary | ICD-10-CM | POA: Diagnosis present

## 2023-03-20 DIAGNOSIS — R402252 Coma scale, best verbal response, oriented, at arrival to emergency department: Secondary | ICD-10-CM | POA: Diagnosis present

## 2023-03-20 DIAGNOSIS — H9190 Unspecified hearing loss, unspecified ear: Secondary | ICD-10-CM | POA: Diagnosis present

## 2023-03-20 DIAGNOSIS — I609 Nontraumatic subarachnoid hemorrhage, unspecified: Secondary | ICD-10-CM

## 2023-03-20 DIAGNOSIS — Z7982 Long term (current) use of aspirin: Secondary | ICD-10-CM | POA: Diagnosis not present

## 2023-03-20 DIAGNOSIS — Z87891 Personal history of nicotine dependence: Secondary | ICD-10-CM | POA: Diagnosis not present

## 2023-03-20 DIAGNOSIS — S02119A Unspecified fracture of occiput, initial encounter for closed fracture: Secondary | ICD-10-CM | POA: Diagnosis present

## 2023-03-20 DIAGNOSIS — R402362 Coma scale, best motor response, obeys commands, at arrival to emergency department: Secondary | ICD-10-CM | POA: Diagnosis present

## 2023-03-20 DIAGNOSIS — Z79899 Other long term (current) drug therapy: Secondary | ICD-10-CM | POA: Diagnosis not present

## 2023-03-20 DIAGNOSIS — S22069A Unspecified fracture of T7-T8 vertebra, initial encounter for closed fracture: Secondary | ICD-10-CM | POA: Diagnosis present

## 2023-03-20 DIAGNOSIS — R402142 Coma scale, eyes open, spontaneous, at arrival to emergency department: Secondary | ICD-10-CM | POA: Diagnosis present

## 2023-03-20 DIAGNOSIS — Z951 Presence of aortocoronary bypass graft: Secondary | ICD-10-CM | POA: Diagnosis not present

## 2023-03-20 DIAGNOSIS — Z23 Encounter for immunization: Secondary | ICD-10-CM

## 2023-03-20 DIAGNOSIS — I2510 Atherosclerotic heart disease of native coronary artery without angina pectoris: Secondary | ICD-10-CM | POA: Diagnosis present

## 2023-03-20 DIAGNOSIS — S065XAA Traumatic subdural hemorrhage with loss of consciousness status unknown, initial encounter: Secondary | ICD-10-CM

## 2023-03-20 DIAGNOSIS — I252 Old myocardial infarction: Secondary | ICD-10-CM

## 2023-03-20 LAB — ETHANOL: Alcohol, Ethyl (B): 11 mg/dL — ABNORMAL HIGH (ref <10)

## 2023-03-20 LAB — SAMPLE TO BLOOD BANK

## 2023-03-20 LAB — COMPREHENSIVE METABOLIC PANEL
ALT: 19 U/L (ref 0–44)
AST: 33 U/L (ref 15–41)
Albumin: 3.7 g/dL (ref 3.5–5.0)
Alkaline Phosphatase: 76 U/L (ref 38–126)
Anion gap: 13 (ref 5–15)
BUN: 6 mg/dL — ABNORMAL LOW (ref 8–23)
CO2: 22 mmol/L (ref 22–32)
Calcium: 8.7 mg/dL — ABNORMAL LOW (ref 8.9–10.3)
Chloride: 99 mmol/L (ref 98–111)
Creatinine, Ser: 0.75 mg/dL (ref 0.61–1.24)
GFR, Estimated: >60 mL/min (ref >60)
Glucose, Bld: 106 mg/dL — ABNORMAL HIGH (ref 70–99)
Potassium: 3.8 mmol/L (ref 3.5–5.1)
Sodium: 134 mmol/L — ABNORMAL LOW (ref 135–145)
Total Bilirubin: 0.8 mg/dL (ref <1.2)
Total Protein: 6.8 g/dL (ref 6.5–8.1)

## 2023-03-20 LAB — CBC WITH DIFFERENTIAL/PLATELET
Abs Immature Granulocytes: 0.09 10*3/uL — ABNORMAL HIGH (ref 0.00–0.07)
Basophils Absolute: 0.1 10*3/uL (ref 0.0–0.1)
Basophils Relative: 0 %
Eosinophils Absolute: 0.1 10*3/uL (ref 0.0–0.5)
Eosinophils Relative: 1 %
HCT: 40.2 % (ref 39.0–52.0)
Hemoglobin: 13.3 g/dL (ref 13.0–17.0)
Immature Granulocytes: 1 %
Lymphocytes Relative: 12 %
Lymphs Abs: 1.4 10*3/uL (ref 0.7–4.0)
MCH: 31.1 pg (ref 26.0–34.0)
MCHC: 33.1 g/dL (ref 30.0–36.0)
MCV: 94.1 fL (ref 80.0–100.0)
Monocytes Absolute: 1 10*3/uL (ref 0.1–1.0)
Monocytes Relative: 9 %
Neutro Abs: 9 10*3/uL — ABNORMAL HIGH (ref 1.7–7.7)
Neutrophils Relative %: 77 %
Platelets: 232 10*3/uL (ref 150–400)
RBC: 4.27 MIL/uL (ref 4.22–5.81)
RDW: 13.3 % (ref 11.5–15.5)
WBC: 11.6 10*3/uL — ABNORMAL HIGH (ref 4.0–10.5)
nRBC: 0 % (ref 0.0–0.2)

## 2023-03-20 LAB — I-STAT CHEM 8, ED
BUN: 8 mg/dL (ref 8–23)
Calcium, Ion: 1.05 mmol/L — ABNORMAL LOW (ref 1.15–1.40)
Chloride: 99 mmol/L (ref 98–111)
Creatinine, Ser: 0.8 mg/dL (ref 0.61–1.24)
Glucose, Bld: 105 mg/dL — ABNORMAL HIGH (ref 70–99)
HCT: 41 % (ref 39.0–52.0)
Hemoglobin: 13.9 g/dL (ref 13.0–17.0)
Potassium: 4 mmol/L (ref 3.5–5.1)
Sodium: 131 mmol/L — ABNORMAL LOW (ref 135–145)
TCO2: 25 mmol/L (ref 22–32)

## 2023-03-20 LAB — I-STAT CG4 LACTIC ACID, ED: Lactic Acid, Venous: 2.5 mmol/L (ref 0.5–1.9)

## 2023-03-20 LAB — APTT: aPTT: 27 s (ref 24–36)

## 2023-03-20 LAB — CDS SEROLOGY

## 2023-03-20 LAB — PROTIME-INR
INR: 1.2 (ref 0.8–1.2)
Prothrombin Time: 14.9 s (ref 11.4–15.2)

## 2023-03-20 MED ORDER — HYDRALAZINE HCL 20 MG/ML IJ SOLN: 10.0000 mg | INTRAMUSCULAR | Status: DC | PRN

## 2023-03-20 MED ORDER — CHLORHEXIDINE GLUCONATE CLOTH 2 % EX PADS
6.0000 | MEDICATED_PAD | Freq: Every day | CUTANEOUS | Status: DC
  Administered 2023-03-20 – 2023-03-21 (×2): 6 via TOPICAL

## 2023-03-20 MED ORDER — SODIUM CHLORIDE 0.9 % IV SOLN: INTRAVENOUS | Status: AC | PRN

## 2023-03-20 MED ORDER — ACETAMINOPHEN 500 MG PO TABS
1000.0000 mg | ORAL_TABLET | Freq: Four times a day (QID) | ORAL | Status: DC
  Administered 2023-03-20 – 2023-03-23 (×7): 1000 mg via ORAL
  Filled 2023-03-20 (×7): qty 2

## 2023-03-20 MED ORDER — METOPROLOL TARTRATE 5 MG/5ML IV SOLN: 5.0000 mg | Freq: Four times a day (QID) | INTRAVENOUS | Status: DC | PRN

## 2023-03-20 MED ORDER — METHOCARBAMOL 1000 MG/10ML IJ SOLN: 500.0000 mg | Freq: Three times a day (TID) | INTRAMUSCULAR | Status: DC

## 2023-03-20 MED ORDER — ONDANSETRON 4 MG PO TBDP: 4.0000 mg | ORAL_TABLET | Freq: Four times a day (QID) | ORAL | Status: DC | PRN

## 2023-03-20 MED ORDER — TRAMADOL HCL 50 MG PO TABS: 25.0000 mg | ORAL_TABLET | Freq: Four times a day (QID) | ORAL | Status: DC | PRN

## 2023-03-20 MED ORDER — POLYETHYLENE GLYCOL 3350 17 G PO PACK: 17.0000 g | PACK | Freq: Every day | ORAL | Status: DC | PRN

## 2023-03-20 MED ORDER — LIDOCAINE-EPINEPHRINE (PF) 2 %-1:200000 IJ SOLN
INTRAMUSCULAR | Status: AC
  Filled 2023-03-20: qty 20

## 2023-03-20 MED ORDER — ONDANSETRON HCL 4 MG/2ML IJ SOLN
4.0000 mg | Freq: Four times a day (QID) | INTRAMUSCULAR | Status: DC | PRN
  Filled 2023-03-20 (×2): qty 2

## 2023-03-20 MED ORDER — LEVETIRACETAM IN NACL 500 MG/100ML IV SOLN
500.0000 mg | Freq: Two times a day (BID) | INTRAVENOUS | Status: DC
  Administered 2023-03-20 – 2023-03-21 (×2): 500 mg via INTRAVENOUS
  Filled 2023-03-20 (×2): qty 100

## 2023-03-20 MED ORDER — TETANUS-DIPHTH-ACELL PERTUSSIS 5-2.5-18.5 LF-MCG/0.5 IM SUSY
0.5000 mL | PREFILLED_SYRINGE | Freq: Once | INTRAMUSCULAR | Status: AC
  Administered 2023-03-20: 0.5 mL via INTRAMUSCULAR

## 2023-03-20 MED ORDER — IOHEXOL 350 MG/ML SOLN: 75.0000 mL | Freq: Once | INTRAVENOUS | Status: DC | PRN

## 2023-03-20 MED ORDER — HYDROMORPHONE HCL 1 MG/ML IJ SOLN: 0.5000 mg | INTRAMUSCULAR | Status: DC | PRN

## 2023-03-20 MED ORDER — DOCUSATE SODIUM 100 MG PO CAPS
100.0000 mg | ORAL_CAPSULE | Freq: Two times a day (BID) | ORAL | Status: DC
  Administered 2023-03-20 – 2023-03-21 (×2): 100 mg via ORAL
  Filled 2023-03-20 (×3): qty 1

## 2023-03-20 MED ORDER — FENTANYL CITRATE PF 50 MCG/ML IJ SOSY
50.0000 ug | PREFILLED_SYRINGE | Freq: Once | INTRAMUSCULAR | Status: AC
  Administered 2023-03-20: 50 ug via INTRAVENOUS
  Filled 2023-03-20: qty 1

## 2023-03-20 MED ORDER — LIDOCAINE-EPINEPHRINE-TETRACAINE (LET) TOPICAL GEL
3.0000 mL | Freq: Once | TOPICAL | Status: AC
  Administered 2023-03-20: 3 mL via TOPICAL
  Filled 2023-03-20: qty 3

## 2023-03-20 MED ORDER — IOHEXOL 350 MG/ML SOLN
75.0000 mL | Freq: Once | INTRAVENOUS | Status: AC | PRN
Start: 2023-03-20 — End: 2023-03-20
  Administered 2023-03-20: 75 mL via INTRAVENOUS

## 2023-03-20 MED ORDER — METHOCARBAMOL 500 MG PO TABS
500.0000 mg | ORAL_TABLET | Freq: Three times a day (TID) | ORAL | Status: DC
  Administered 2023-03-20 – 2023-03-23 (×6): 500 mg via ORAL
  Filled 2023-03-20 (×6): qty 1

## 2023-03-20 MED ORDER — LIDOCAINE HCL (PF) 1 % IJ SOLN: 5.0000 mL | Freq: Once | INTRAMUSCULAR | Status: DC

## 2023-03-20 MED ORDER — CEFAZOLIN SODIUM-DEXTROSE 2-4 GM/100ML-% IV SOLN
2.0000 g | Freq: Once | INTRAVENOUS | Status: AC
  Administered 2023-03-20: 2 g via INTRAVENOUS

## 2023-03-20 NOTE — ED Notes (Signed)
Pt transported to CT on monitor w/ Trauma RN
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
## 2023-03-20 NOTE — TOC CAGE-AID Note (Signed)
Transition of Care [facility]) - CAGE-AID Screening   Patient Details  Name: Devario Bucklew MRN: [id number] Date of Birth: Apr 02, 1935  [facility]) CM/SW Contact:    Katha Hamming, RN Phone Number: 03/20/2023, 9:24 PM   Clinical Narrative: Occasional glass of wine or beer per wife. Pt HOH   CAGE-AID Screening:    Have You Ever Felt You Ought to Cut Down on Your Drinking or Drug Use?: No Have People Annoyed You By Critizing Your Drinking Or Drug Use?: No Have You Felt Bad Or Guilty About Your Drinking Or Drug Use?: No Have You Ever Had a Drink or Used Drugs First Thing In The Morning to Steady Your Nerves or to Get Rid of a Hangover?: No CAGE-AID Score: 0  Substance Abuse Education Offered: No
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
## 2023-03-21 ENCOUNTER — Inpatient Hospital Stay (HOSPITAL_COMMUNITY): Payer: Medicare Other

## 2023-03-21 LAB — CBC
HCT: 33.3 % — ABNORMAL LOW (ref 39.0–52.0)
Hemoglobin: 11.4 g/dL — ABNORMAL LOW (ref 13.0–17.0)
MCH: 32 pg (ref 26.0–34.0)
MCHC: 34.2 g/dL (ref 30.0–36.0)
MCV: 93.5 fL (ref 80.0–100.0)
Platelets: 198 10*3/uL (ref 150–400)
RBC: 3.56 MIL/uL — ABNORMAL LOW (ref 4.22–5.81)
RDW: 13.4 % (ref 11.5–15.5)
WBC: 9.4 10*3/uL (ref 4.0–10.5)
nRBC: 0 % (ref 0.0–0.2)

## 2023-03-21 LAB — BASIC METABOLIC PANEL
Anion gap: 13 (ref 5–15)
BUN: 13 mg/dL (ref 8–23)
CO2: 21 mmol/L — ABNORMAL LOW (ref 22–32)
Calcium: 8.2 mg/dL — ABNORMAL LOW (ref 8.9–10.3)
Chloride: 99 mmol/L (ref 98–111)
Creatinine, Ser: 0.8 mg/dL (ref 0.61–1.24)
GFR, Estimated: >60 mL/min (ref >60)
Glucose, Bld: 128 mg/dL — ABNORMAL HIGH (ref 70–99)
Potassium: 4.3 mmol/L (ref 3.5–5.1)
Sodium: 133 mmol/L — ABNORMAL LOW (ref 135–145)

## 2023-03-21 LAB — MRSA NEXT GEN BY PCR, NASAL: MRSA by PCR Next Gen: NOT DETECTED

## 2023-03-21 MED ORDER — TAMSULOSIN HCL 0.4 MG PO CAPS
0.4000 mg | ORAL_CAPSULE | Freq: Every day | ORAL | Status: DC
  Administered 2023-03-21 – 2023-03-22 (×2): 0.4 mg via ORAL
  Filled 2023-03-21 (×2): qty 1

## 2023-03-21 MED ORDER — TRAMADOL HCL 50 MG PO TABS: 25.0000 mg | ORAL_TABLET | ORAL | Status: DC | PRN

## 2023-03-21 MED ORDER — PRAVASTATIN SODIUM 40 MG PO TABS
40.0000 mg | ORAL_TABLET | Freq: Every day | ORAL | Status: DC
  Administered 2023-03-21 – 2023-03-22 (×2): 40 mg via ORAL
  Filled 2023-03-21 (×2): qty 1

## 2023-03-21 MED ORDER — VITAMIN D (ERGOCALCIFEROL) 1.25 MG (50000 UNIT) PO CAPS: 50000.0000 [IU] | ORAL_CAPSULE | ORAL | Status: DC

## 2023-03-21 MED ORDER — LEVETIRACETAM 500 MG PO TABS
500.0000 mg | ORAL_TABLET | Freq: Two times a day (BID) | ORAL | Status: DC
  Administered 2023-03-21 – 2023-03-23 (×4): 500 mg via ORAL
  Filled 2023-03-21 (×4): qty 1

## 2023-03-21 MED ORDER — PROSIGHT PO TABS
2.0000 | ORAL_TABLET | Freq: Every day | ORAL | Status: DC
  Administered 2023-03-21 – 2023-03-23 (×3): 2 via ORAL
  Filled 2023-03-21 (×4): qty 2

## 2023-03-21 MED ORDER — ORAL CARE MOUTH RINSE: 15.0000 mL | OROMUCOSAL | Status: DC | PRN

## 2023-03-21 NOTE — Plan of Care (Signed)

## 2023-03-21 NOTE — Progress Notes (Signed)
Orthopedic Tech Progress Note Patient Details:  John [name] 1935/04/17 [id number]  Patient ID: John [name], male   DOB: 08/31/1934, 88 y.o.   MRN: [id number] Adjusted TLSO. Sternum piece was almost touching his chin when seated. Made some adjustments on the corsett area and got everything in a better position. Have asked OT  to monitor and advise as needed. Tonye Pearson 03/21/2023, 12:46 PM
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
## 2023-03-21 NOTE — Progress Notes (Signed)
Orthopedic Tech Progress Note Patient Details:  John [name] 03-24-1935 [id number]  Ortho Devices Type of Ortho Device: Thoracolumbar corset (TLSO) Ortho Device/Splint Location: Back Ortho Device/Splint Interventions: Ordered      [name] 03/21/2023, 8:46 AM
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
## 2023-03-22 LAB — CBC
HCT: 28.4 % — ABNORMAL LOW (ref 39.0–52.0)
Hemoglobin: 9.5 g/dL — ABNORMAL LOW (ref 13.0–17.0)
MCH: 31 pg (ref 26.0–34.0)
MCHC: 33.5 g/dL (ref 30.0–36.0)
MCV: 92.8 fL (ref 80.0–100.0)
Platelets: 179 10*3/uL (ref 150–400)
RBC: 3.06 MIL/uL — ABNORMAL LOW (ref 4.22–5.81)
RDW: 13.4 % (ref 11.5–15.5)
WBC: 8.3 10*3/uL (ref 4.0–10.5)
nRBC: 0 % (ref 0.0–0.2)
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
## 2023-03-22 NOTE — TOC Transition Note (Signed)
Transition of Care [facility]) - CM/SW Discharge Note   Patient Details  Name: John [name] MRN: [id number] Date of Birth: Sep 21, 1934  [facility] CM/SW Contact:  Lawerance Sabal, RN Phone Number: 03/22/2023, 1:48 PM   Clinical Narrative:      Sherron Monday w patient's wife to coordinate DC services. She states that they have WC, RW, shower seat, and cane at home, no DME needed  She would like [facility] services and Frances Furbish is agreeable to service       Patient Goals and CMS Choice      Discharge Placement                         Discharge Plan and Services Additional resources added to the After Visit Summary for                                       Social Determinants of Health (SDOH) Interventions SDOH Screenings   Tobacco Use: Medium Risk (03/20/2023)     Readmission Risk Interventions     No data to display
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
## 2023-03-23 LAB — CBC
HCT: 27.2 % — ABNORMAL LOW (ref 39.0–52.0)
Hemoglobin: 9.1 g/dL — ABNORMAL LOW (ref 13.0–17.0)
MCH: 31.3 pg (ref 26.0–34.0)
MCHC: 33.5 g/dL (ref 30.0–36.0)
MCV: 93.5 fL (ref 80.0–100.0)
Platelets: 183 10*3/uL (ref 150–400)
RBC: 2.91 MIL/uL — ABNORMAL LOW (ref 4.22–5.81)
RDW: 13.4 % (ref 11.5–15.5)
WBC: 7.5 10*3/uL (ref 4.0–10.5)
nRBC: 0 % (ref 0.0–0.2)

## 2023-03-23 MED ORDER — DOCUSATE SODIUM 100 MG PO CAPS: 100.0000 mg | ORAL_CAPSULE | Freq: Two times a day (BID) | ORAL | 0 refills | Status: DC

## 2023-03-23 MED ORDER — METHOCARBAMOL 500 MG PO TABS: 500.0000 mg | ORAL_TABLET | Freq: Three times a day (TID) | ORAL | 0 refills | Status: DC | PRN

## 2023-03-23 MED ORDER — DOUBLE ANTIBIOTIC 500-10000 UNIT/GM EX OINT: 1.0000 | TOPICAL_OINTMENT | Freq: Two times a day (BID) | CUTANEOUS | 0 refills | Status: AC

## 2023-03-23 MED ORDER — ACETAMINOPHEN 500 MG PO TABS: 1000.0000 mg | ORAL_TABLET | Freq: Four times a day (QID) | ORAL | 0 refills | Status: DC

## 2023-03-23 MED ORDER — LEVETIRACETAM 500 MG PO TABS: 500.0000 mg | ORAL_TABLET | Freq: Two times a day (BID) | ORAL | 0 refills | Status: DC
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
## 2023-03-23 NOTE — Progress Notes (Addendum)
Patient ready for discharge to home; discharge instructions given and reviewed; Rx's sent electronically; all wound care completed today and reviewed with the wife; Patient dressed with assistance and reapplied his back brace; discharged out via wheelchair; accompanied home by his wife.
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
## 2023-05-09 ENCOUNTER — Other Ambulatory Visit: Payer: Self-pay | Admitting: Nurse Practitioner

## 2023-08-10 DIAGNOSIS — I499 Cardiac arrhythmia, unspecified: Secondary | ICD-10-CM | POA: Diagnosis not present

## 2023-08-10 DIAGNOSIS — I350 Nonrheumatic aortic (valve) stenosis: Secondary | ICD-10-CM | POA: Diagnosis not present

## 2023-08-20 ENCOUNTER — Other Ambulatory Visit: Payer: Self-pay | Admitting: Family Medicine

## 2023-08-20 DIAGNOSIS — N2889 Other specified disorders of kidney and ureter: Secondary | ICD-10-CM

## 2023-08-25 ENCOUNTER — Ambulatory Visit
Admission: RE | Admit: 2023-08-25 | Discharge: 2023-08-25 | Disposition: A | Discharge status: Home / Self Care | Source: Ambulatory Visit | Attending: Family Medicine | Admitting: Family Medicine

## 2023-08-25 DIAGNOSIS — N2889 Other specified disorders of kidney and ureter: Secondary | ICD-10-CM
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
## 2023-10-14 ENCOUNTER — Encounter: Payer: Self-pay | Admitting: Physician Assistant

## 2023-10-14 ENCOUNTER — Ambulatory Visit: Attending: Physician Assistant | Admitting: Physician Assistant

## 2023-10-14 VITALS — BP 136/60 | HR 65 | Ht 67.0 in | Wt 149.0 lb

## 2023-10-14 DIAGNOSIS — I2510 Atherosclerotic heart disease of native coronary artery without angina pectoris: Secondary | ICD-10-CM

## 2023-10-14 DIAGNOSIS — I471 Supraventricular tachycardia, unspecified: Secondary | ICD-10-CM | POA: Diagnosis not present

## 2023-10-14 DIAGNOSIS — I350 Nonrheumatic aortic (valve) stenosis: Secondary | ICD-10-CM

## 2023-10-14 DIAGNOSIS — I10 Essential (primary) hypertension: Secondary | ICD-10-CM | POA: Diagnosis not present

## 2023-10-14 DIAGNOSIS — E7800 Pure hypercholesterolemia, unspecified: Secondary | ICD-10-CM

## 2023-10-14 NOTE — Patient Instructions (Signed)
 Medication Instructions:  Your physician recommends that you continue on your current medications as directed. Please refer to the Current Medication list given to you today.  *If you need a refill on your cardiac medications before your next appointment, please call your pharmacy*  Lab Work: NONE If you have labs (blood work) drawn today and your tests are completely normal, you will receive your results only by: MyChart Message (if you have MyChart) OR A paper copy in the mail If you have any lab test that is abnormal or we need to change your treatment, we will call you to review the results.  Testing/Procedures: NONE  Follow-Up: At [facility], you and your health needs are our priority.  As part of our continuing mission to provide you with exceptional heart care, our providers are all part of one team.  This team includes your primary Cardiologist (physician) and Advanced Practice Providers or APPs (Physician Assistants and Nurse Practitioners) who all work together to provide you with the care you need, when you need it.  Your next appointment:   6 month(s)  Provider:   Dorothye Gathers, MD   We recommend signing up for the patient portal called "MyChart".  Sign up information is provided on this After Visit Summary.  MyChart is used to connect with patients for Virtual Visits (Telemedicine).  Patients are able to view lab/test results, encounter notes, upcoming appointments, etc.  Non-urgent messages can be sent to your provider as well.   To learn more about what you can do with MyChart, go to [email or web address].
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
## 2023-11-12 ENCOUNTER — Ambulatory Visit: Admitting: General Practice

## 2023-11-17 ENCOUNTER — Ambulatory Visit: Attending: Physician Assistant | Admitting: Physician Assistant

## 2023-11-17 ENCOUNTER — Encounter: Payer: Self-pay | Admitting: Physician Assistant

## 2023-11-17 VITALS — BP 120/70 | HR 65 | Ht 67.0 in | Wt 150.2 lb

## 2023-11-17 DIAGNOSIS — I259 Chronic ischemic heart disease, unspecified: Secondary | ICD-10-CM

## 2023-11-17 DIAGNOSIS — I2510 Atherosclerotic heart disease of native coronary artery without angina pectoris: Secondary | ICD-10-CM | POA: Diagnosis not present
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
## 2023-11-17 NOTE — Patient Instructions (Signed)
 Medication Instructions:  Your physician recommends that you continue on your current medications as directed. Please refer to the Current Medication list given to you today.  *If you need a refill on your cardiac medications before your next appointment, please call your pharmacy*  Lab Work: NONE If you have labs (blood work) drawn today and your tests are completely normal, you will receive your results only by: MyChart Message (if you have MyChart) OR A paper copy in the mail If you have any lab test that is abnormal or we need to change your treatment, we will call you to review the results.  Testing/Procedures: Your physician has requested that you have a lexiscan  myoview . For further information please visit [email or web address] Please follow instruction sheet, as given.   Follow-Up: At [facility], you and your health needs are our priority.  As part of our continuing mission to provide you with exceptional heart care, our providers are all part of one team.  This team includes your primary Cardiologist (physician) and Advanced Practice Providers or APPs (Physician Assistants and Nurse Practitioners) who all work together to provide you with the care you need, when you need it.  Your next appointment:   AFTER TESTING IS COMPLETE   Provider:   Oneil Parchment, MD  We recommend signing up for the patient portal called MyChart.  Sign up information is provided on this After Visit Summary.  MyChart is used to connect with patients for Virtual Visits (Telemedicine).  Patients are able to view lab/test results, encounter notes, upcoming appointments, etc.  Non-urgent messages can be sent to your provider as well.   To learn more about what you can do with MyChart, go to [email or web address].

## 2023-11-26 ENCOUNTER — Telehealth (HOSPITAL_COMMUNITY): Payer: Self-pay | Admitting: *Deleted

## 2023-11-26 ENCOUNTER — Other Ambulatory Visit: Payer: Self-pay | Admitting: Physician Assistant

## 2023-11-26 DIAGNOSIS — I259 Chronic ischemic heart disease, unspecified: Secondary | ICD-10-CM

## 2023-11-26 NOTE — Telephone Encounter (Signed)
 Left message on voicemail per DPR in reference to upcoming appointment scheduled on 12/01/2023 at 10:30 with detailed instructions given per Myocardial Perfusion Study Information Sheet for the test. LM to arrive 15 minutes early, and that it is imperative to arrive on time for appointment to keep from having the test rescheduled. If you need to cancel or reschedule your appointment, please call the office within 24 hours of your appointment. Failure to do so may result in a cancellation of your appointment, and a $50 no show fee. Phone number given for call back for any questions.

## 2023-12-01 ENCOUNTER — Ambulatory Visit (HOSPITAL_COMMUNITY)
Admission: RE | Admit: 2023-12-01 | Discharge: 2023-12-01 | Disposition: A | Discharge status: Home / Self Care | Source: Ambulatory Visit | Attending: Cardiology | Admitting: Cardiology

## 2023-12-01 DIAGNOSIS — I259 Chronic ischemic heart disease, unspecified: Secondary | ICD-10-CM | POA: Diagnosis not present

## 2023-12-01 LAB — MYOCARDIAL PERFUSION IMAGING
LV dias vol: 103 mL (ref 62–150)
LV sys vol: 32 mL (ref 4.2–5.8)
Nuc Stress EF: 69 %
Peak HR: 72 {beats}/min
Rest HR: 52 {beats}/min
Rest Nuclear Isotope Dose: 10.9 mCi
SDS: 4
SRS: 11
SSS: 14
ST Depression (mm): 0 mm
Stress Nuclear Isotope Dose: 32.8 mCi
TID: 1

## 2023-12-01 MED ORDER — REGADENOSON 0.4 MG/5ML IV SOLN
INTRAVENOUS | Status: AC
  Filled 2023-12-01: qty 5

## 2023-12-01 MED ORDER — REGADENOSON 0.4 MG/5ML IV SOLN
0.4000 mg | Freq: Once | INTRAVENOUS | Status: AC
  Administered 2023-12-01: 0.4 mg via INTRAVENOUS

## 2023-12-01 MED ORDER — TECHNETIUM TC 99M TETROFOSMIN IV KIT
32.8000 | PACK | Freq: Once | INTRAVENOUS | Status: AC | PRN
  Administered 2023-12-01: 32.8 via INTRAVENOUS

## 2023-12-01 MED ORDER — TECHNETIUM TC 99M TETROFOSMIN IV KIT
10.9000 | PACK | Freq: Once | INTRAVENOUS | Status: AC | PRN
Start: 2023-12-01 — End: 2023-12-01
  Administered 2023-12-01: 10.9 via INTRAVENOUS

## 2023-12-02 ENCOUNTER — Ambulatory Visit: Payer: Self-pay | Admitting: Physician Assistant

## 2023-12-03 ENCOUNTER — Ambulatory Visit: Payer: Self-pay | Admitting: Physician Assistant

## 2023-12-03 DIAGNOSIS — I259 Chronic ischemic heart disease, unspecified: Secondary | ICD-10-CM
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
## 2023-12-15 ENCOUNTER — Ambulatory Visit: Attending: Physician Assistant | Admitting: Physician Assistant

## 2023-12-15 ENCOUNTER — Encounter: Payer: Self-pay | Admitting: Physician Assistant

## 2023-12-15 VITALS — BP 126/56 | HR 80 | Ht 67.0 in | Wt 147.0 lb

## 2023-12-15 DIAGNOSIS — I471 Supraventricular tachycardia, unspecified: Secondary | ICD-10-CM | POA: Diagnosis not present

## 2023-12-15 DIAGNOSIS — I2510 Atherosclerotic heart disease of native coronary artery without angina pectoris: Secondary | ICD-10-CM

## 2023-12-15 DIAGNOSIS — I350 Nonrheumatic aortic (valve) stenosis: Secondary | ICD-10-CM

## 2023-12-15 DIAGNOSIS — E782 Mixed hyperlipidemia: Secondary | ICD-10-CM | POA: Diagnosis not present

## 2023-12-15 DIAGNOSIS — I10 Essential (primary) hypertension: Secondary | ICD-10-CM | POA: Diagnosis not present

## 2023-12-15 NOTE — Patient Instructions (Signed)
 Medication Instructions:  NO CHANGES  Lab Work: NONE TO BE DONE TODAY.  Testing/Procedures: Your physician has requested that you have an echocardiogram. Echocardiography is a painless test that uses sound waves to create images of your heart. It provides your doctor with information about the size and shape of your heart and how well your heart's chambers and valves are working. This procedure takes approximately one hour. There are no restrictions for this procedure. Please do NOT wear cologne, perfume, aftershave, or lotions (deodorant is allowed). Please arrive 15 minutes prior to your appointment time.  Please note: We ask at that you not bring children with you during ultrasound (echo/ vascular) testing. Due to room size and safety concerns, children are not allowed in the ultrasound rooms during exams. Our front office staff cannot provide observation of children in our lobby area while testing is being conducted. An adult accompanying a patient to their appointment will only be allowed in the ultrasound room at the discretion of the ultrasound technician under special circumstances. We apologize for any inconvenience.   Follow-Up: At [facility], you and your health needs are our priority.  As part of our continuing mission to provide you with exceptional heart care, our providers are all part of one team.  This team includes your primary Cardiologist (physician) and Advanced Practice Providers or APPs (Physician Assistants and Nurse Practitioners) who all work together to provide you with the care you need, when you need it.  Your next appointment:   6 MONTHS  Provider:   Oneil Parchment, MD

## 2023-12-16 ENCOUNTER — Ambulatory Visit (HOSPITAL_COMMUNITY)
Admission: RE | Admit: 2023-12-16 | Discharge: 2023-12-16 | Disposition: A | Discharge status: Home / Self Care | Source: Ambulatory Visit | Attending: Physician Assistant | Admitting: Physician Assistant

## 2023-12-16 ENCOUNTER — Ambulatory Visit: Payer: Self-pay | Admitting: Physician Assistant

## 2023-12-16 DIAGNOSIS — I259 Chronic ischemic heart disease, unspecified: Secondary | ICD-10-CM | POA: Insufficient documentation

## 2023-12-16 LAB — ECHOCARDIOGRAM COMPLETE
AR max vel: 2.15 cm2
AV Area VTI: 1.97 cm2
AV Area mean vel: 1.92 cm2
AV Mean grad: 6 mmHg
AV Peak grad: 9.9 mmHg
Ao pk vel: 1.57 m/s
S' Lateral: 3.52 cm

## 2024-02-11 ENCOUNTER — Other Ambulatory Visit: Payer: Self-pay

## 2024-02-12 NOTE — Telephone Encounter (Signed)
 Former Pt of Dr. Hobart. Looks like Dr. Jeffrie will be Pt's next Dr. Does Dr. Jeffrie want to refill this? Please advise

## 2024-02-17 NOTE — Telephone Encounter (Signed)
 Please obtain from PCP or OTC

## 2024-03-16 ENCOUNTER — Other Ambulatory Visit: Payer: Self-pay | Admitting: Nurse Practitioner

## 2024-03-18 NOTE — Telephone Encounter (Signed)
 Pt of Dr. Jeffrie. Does Dr. Jeffrie want to refill this RX? Please advise.
